# Patient Record
Sex: Female | Born: 1960 | Race: Asian | Hispanic: No | Marital: Single | Smoking: Never smoker
Health system: Southern US, Community
[De-identification: ages and names within clinical notes are randomized; demographics above are authoritative.]

## PROBLEM LIST (undated history)

## (undated) DIAGNOSIS — N809 Endometriosis, unspecified: Secondary | ICD-10-CM

## (undated) DIAGNOSIS — E785 Hyperlipidemia, unspecified: Secondary | ICD-10-CM

## (undated) DIAGNOSIS — I10 Essential (primary) hypertension: Secondary | ICD-10-CM

## (undated) HISTORY — DX: Endometriosis, unspecified: N80.9

## (undated) HISTORY — DX: Hyperlipidemia, unspecified: E78.5

---

## 2000-04-24 ENCOUNTER — Other Ambulatory Visit: Admission: RE | Admit: 2000-04-24 | Discharge: 2000-04-24 | Payer: Self-pay | Admitting: Obstetrics and Gynecology

## 2000-10-14 ENCOUNTER — Encounter: Admission: RE | Admit: 2000-10-14 | Discharge: 2000-10-14 | Payer: Self-pay | Admitting: Hematology and Oncology

## 2001-01-20 ENCOUNTER — Encounter: Admission: RE | Admit: 2001-01-20 | Discharge: 2001-01-20 | Payer: Self-pay

## 2001-05-05 ENCOUNTER — Other Ambulatory Visit: Admission: RE | Admit: 2001-05-05 | Discharge: 2001-05-05 | Payer: Self-pay | Admitting: Obstetrics and Gynecology

## 2001-07-27 ENCOUNTER — Encounter: Admission: RE | Admit: 2001-07-27 | Discharge: 2001-07-27 | Payer: Self-pay | Admitting: Internal Medicine

## 2002-05-07 ENCOUNTER — Other Ambulatory Visit: Admission: RE | Admit: 2002-05-07 | Discharge: 2002-05-07 | Payer: Self-pay | Admitting: Obstetrics and Gynecology

## 2003-05-17 ENCOUNTER — Ambulatory Visit (HOSPITAL_COMMUNITY): Admission: RE | Admit: 2003-05-17 | Discharge: 2003-05-17 | Payer: Self-pay | Admitting: Obstetrics and Gynecology

## 2003-05-17 ENCOUNTER — Encounter: Payer: Self-pay | Admitting: Obstetrics and Gynecology

## 2003-05-31 ENCOUNTER — Other Ambulatory Visit: Admission: RE | Admit: 2003-05-31 | Discharge: 2003-05-31 | Payer: Self-pay | Admitting: Obstetrics and Gynecology

## 2004-05-17 ENCOUNTER — Ambulatory Visit (HOSPITAL_COMMUNITY): Admission: RE | Admit: 2004-05-17 | Discharge: 2004-05-17 | Payer: Self-pay | Admitting: Obstetrics and Gynecology

## 2004-06-05 ENCOUNTER — Other Ambulatory Visit: Admission: RE | Admit: 2004-06-05 | Discharge: 2004-06-05 | Payer: Self-pay | Admitting: Obstetrics and Gynecology

## 2005-05-20 ENCOUNTER — Ambulatory Visit (HOSPITAL_COMMUNITY): Admission: RE | Admit: 2005-05-20 | Discharge: 2005-05-20 | Payer: Self-pay | Admitting: Obstetrics and Gynecology

## 2005-08-21 ENCOUNTER — Other Ambulatory Visit: Admission: RE | Admit: 2005-08-21 | Discharge: 2005-08-21 | Payer: Self-pay | Admitting: Obstetrics and Gynecology

## 2005-12-04 ENCOUNTER — Encounter: Payer: Self-pay | Admitting: Family Medicine

## 2006-05-29 ENCOUNTER — Ambulatory Visit (HOSPITAL_COMMUNITY): Admission: RE | Admit: 2006-05-29 | Discharge: 2006-05-29 | Payer: Self-pay | Admitting: Obstetrics and Gynecology

## 2006-09-10 ENCOUNTER — Ambulatory Visit: Payer: Self-pay | Admitting: Internal Medicine

## 2006-09-16 ENCOUNTER — Ambulatory Visit: Payer: Self-pay | Admitting: Internal Medicine

## 2006-09-25 ENCOUNTER — Other Ambulatory Visit: Admission: RE | Admit: 2006-09-25 | Discharge: 2006-09-25 | Payer: Self-pay | Admitting: Obstetrics & Gynecology

## 2006-12-05 ENCOUNTER — Ambulatory Visit: Payer: Self-pay | Admitting: Internal Medicine

## 2007-07-13 ENCOUNTER — Ambulatory Visit (HOSPITAL_COMMUNITY): Admission: RE | Admit: 2007-07-13 | Discharge: 2007-07-13 | Payer: Self-pay | Admitting: Obstetrics and Gynecology

## 2007-08-26 ENCOUNTER — Ambulatory Visit: Payer: Self-pay | Admitting: Family Medicine

## 2007-08-26 DIAGNOSIS — E785 Hyperlipidemia, unspecified: Secondary | ICD-10-CM | POA: Insufficient documentation

## 2007-08-28 ENCOUNTER — Encounter (INDEPENDENT_AMBULATORY_CARE_PROVIDER_SITE_OTHER): Payer: Self-pay | Admitting: *Deleted

## 2007-08-28 LAB — CONVERTED CEMR LAB
AST: 18 units/L (ref 0–37)
Albumin: 3.9 g/dL (ref 3.5–5.2)
Alkaline Phosphatase: 46 units/L (ref 39–117)
BUN: 10 mg/dL (ref 6–23)
Chloride: 109 meq/L (ref 96–112)
Cholesterol: 226 mg/dL (ref 0–200)
Creatinine, Ser: 0.7 mg/dL (ref 0.4–1.2)
Sodium: 143 meq/L (ref 135–145)
Total Bilirubin: 0.7 mg/dL (ref 0.3–1.2)
VLDL: 20 mg/dL (ref 0–40)

## 2007-09-01 ENCOUNTER — Encounter: Payer: Self-pay | Admitting: Family Medicine

## 2007-10-15 ENCOUNTER — Other Ambulatory Visit: Admission: RE | Admit: 2007-10-15 | Discharge: 2007-10-15 | Payer: Self-pay | Admitting: Obstetrics and Gynecology

## 2008-06-01 ENCOUNTER — Ambulatory Visit: Payer: Self-pay | Admitting: Family Medicine

## 2008-06-01 DIAGNOSIS — K120 Recurrent oral aphthae: Secondary | ICD-10-CM

## 2008-08-03 ENCOUNTER — Ambulatory Visit (HOSPITAL_COMMUNITY): Admission: RE | Admit: 2008-08-03 | Discharge: 2008-08-03 | Payer: Self-pay | Admitting: Obstetrics and Gynecology

## 2008-11-17 ENCOUNTER — Other Ambulatory Visit: Admission: RE | Admit: 2008-11-17 | Discharge: 2008-11-17 | Payer: Self-pay | Admitting: Obstetrics and Gynecology

## 2008-12-01 ENCOUNTER — Ambulatory Visit: Payer: Self-pay | Admitting: Family Medicine

## 2008-12-01 DIAGNOSIS — T1490XA Injury, unspecified, initial encounter: Secondary | ICD-10-CM | POA: Insufficient documentation

## 2008-12-05 ENCOUNTER — Other Ambulatory Visit: Admission: RE | Admit: 2008-12-05 | Discharge: 2008-12-05 | Payer: Self-pay | Admitting: Obstetrics and Gynecology

## 2009-05-10 ENCOUNTER — Ambulatory Visit: Payer: Self-pay | Admitting: Family Medicine

## 2009-05-10 DIAGNOSIS — R142 Eructation: Secondary | ICD-10-CM

## 2009-05-10 DIAGNOSIS — R143 Flatulence: Secondary | ICD-10-CM

## 2009-05-10 DIAGNOSIS — R141 Gas pain: Secondary | ICD-10-CM

## 2009-05-10 DIAGNOSIS — H6980 Other specified disorders of Eustachian tube, unspecified ear: Secondary | ICD-10-CM | POA: Insufficient documentation

## 2009-05-10 DIAGNOSIS — L989 Disorder of the skin and subcutaneous tissue, unspecified: Secondary | ICD-10-CM | POA: Insufficient documentation

## 2009-05-17 ENCOUNTER — Telehealth: Payer: Self-pay | Admitting: Family Medicine

## 2009-08-23 ENCOUNTER — Ambulatory Visit (HOSPITAL_COMMUNITY): Admission: RE | Admit: 2009-08-23 | Discharge: 2009-08-23 | Payer: Self-pay | Admitting: Obstetrics and Gynecology

## 2009-08-25 ENCOUNTER — Ambulatory Visit: Payer: Self-pay | Admitting: Family Medicine

## 2009-08-25 DIAGNOSIS — K219 Gastro-esophageal reflux disease without esophagitis: Secondary | ICD-10-CM

## 2010-10-04 ENCOUNTER — Ambulatory Visit (HOSPITAL_COMMUNITY): Admission: RE | Admit: 2010-10-04 | Discharge: 2010-10-04 | Payer: Self-pay | Admitting: Obstetrics and Gynecology

## 2011-02-28 LAB — HM PAP SMEAR

## 2011-04-19 NOTE — Assessment & Plan Note (Signed)
[facility]                           GASTROENTEROLOGY OFFICE NOTE   NAME:SHIRAISHICyrena, [name]                      MRN:          [id number]  DATE:09/10/2006                            DOB:          10-29-1961    [name] is a very nice 51 year old Asian woman, who we saw 4 years ago  for evaluation of _______location of abdominal pain and change in bowel  habits.  Her colonoscopy at that time showed internal hemorrhoids, but  otherwise normal exam.  She comes today with complaints of extreme bloating  and gas, which is passed as flatus but also belches.  She has some lactose  intolerance.  This has been going on for about a year.  She initially made  an appointment a year ago, but could not keep it.  Her eating habits are  regular.  She drinks usually coffee for breakfast, has [place] meal for  lunch, and some fish with vegetables for supper.  Her weight has been stable  at 120 pounds.  She denies any symptoms of gastroesophageal reflux.  There  is no family history of gallbladder disease.  She denies any excessive  stress.   FAMILY HISTORY:  Positive for diabetes in her father.  No family history of  colon cancer.   SOCIAL HISTORY:  She is single, lives with her boyfriend, works as a  Production designer, theatre/television/film.  She does not smoke, does not drink alcohol.   REVIEW OF SYSTEMS:  Positive for GI symptoms.   PHYSICAL EXAMINATION:  Blood pressure 118/80, pulse 68, weight 121 pounds.  She was mildly anxious, alert, oriented, healthy-appearing.  SKIN:  Warm and dry.  LUNGS:  Clear to auscultation.  Cor with normal S1, normal S2.  ABDOMEN:  Soft, somewhat tympanitic in the upper abdomen, with somewhat  hyperactive bowel sounds.  There was minimal discomfort in the left lower  quadrant, normal exam of the upper abdomen with liver edge at costal margin,  no scars.  RECTAL:  Exam normal rectal tone, stool was soft, Hemoccult-negative.   IMPRESSION:  A 50 year old  Asian woman with dyspepsia, bloating, flatulence,  suggestive for functional gastrointestinal problems.  We need to rule out  biliary dysfunction, celiac sprue, bacterial overgrowth, possible  constipation.   PLAN:  1. Check tissue transglutaminase and CBC today.  2. Start Pepcid 40 mg p.o. daily.  3. For bacterial overgrowth Flagyl 250 mg p.o. t.i.d. for one week.  4. Because of constipation and reported from the patient that colonoscopy      prep helped her last time, we will give her MiraLax prep to use it one      time, then continue 17 grams of MiraLax twice a week.  5. Return to the office in 8 weeks to follow up.  I would consider using      antispasmodics in the future.       Hedwig Morton. Juanda Chance, MD      DMB/MedQ  DD:  09/10/2006  DT:  09/12/2006  Job #:  [id number]   cc:   Clayborn Bigness, MD

## 2011-04-19 NOTE — Assessment & Plan Note (Signed)
[facility]                         GASTROENTEROLOGY OFFICE NOTE   NAME:SHIRAISHIKendel, Pesnell                      MRN:          [id number]  DATE:12/05/2006                            DOB:          07/19/1961    Ms. Austin is a 50 year old Asian female, whom we have been following  for irritable bowel syndrome.  Her main symptom is gas.  She feels she  has inappropriate flatulence.  The problem is that she does not know  when she passes gas and she is surprised when people turn around and  look at her.  She is afraid to leave her house, except for going to  work.  She has tried to eliminate certain foods.  She has taken the  usual over-the-counter medications.  There is no abdominal pain.  We  have been treating her constipation with MiraLax 17 g two to three times  a week with good results.  There has been no constipation, but the gas  problem continues.  She had a colonoscopy in July 2003, which showed  internal hemorrhoids, but otherwise normal exam.   Her medications include calcium supplements, estrogen, doxycycline,  Pepcid 40 mg a day and MiraLax 17 g daily.   PHYSICAL EXAMINATION:  Blood pressure 120/70, pulse 60 and weight 126  pounds and stable.  She was alert and oriented and in no distress.  Lungs were clear to auscultation.  Cor with normal S1, normal S2.  Abdomen was soft and relaxed.  Normoactive bowel sounds.  No abnormal  rashes, no distention, no tenderness.  Rectal exam showed normal rectal  tone.  There was no incompetency.  Squeeze was normal.  Stool was soft,  Hemoccult negative.   The patient is a 50 year old Asian female with irritable bowel  syndrome/flatulence.  I am not sure that she actually has flatulence or  if it is just an anxiety reaction on her part.  This apparently occurs  at work, as well as at home.  She has no other symptoms, such as  abdominal pain or change in the bowel habits, to indicate spastic colon.  We  have tried her on Flagyl to stabilize her bacterial flora with no  noticeable results.   PLAN:  1. A trial of Align one p.o. daily, samples given for two weeks.  2. Robinul Forte 2 mg q.a.m. as an antispasmodic.  3. Begin Elavil 25 mg q.h.s. to reduce  the GI sensation and      hypersensitivity.  4. I would like to see her in six to eight weeks again and discuss the      problem again.     Hedwig Morton. Juanda Chance, MD  Electronically Signed    DMB/MedQ  DD: 12/05/2006  DT: 12/05/2006  Job #: [id number]   cc:   Ross Marcus, Dr.

## 2011-08-29 ENCOUNTER — Encounter: Payer: Self-pay | Admitting: Family Medicine

## 2011-08-29 ENCOUNTER — Telehealth: Payer: Self-pay | Admitting: Family Medicine

## 2011-08-29 NOTE — Telephone Encounter (Signed)
Pt would like to discuss liquid coming out of back of both ears.  Not sure why piercing is infected, has not worn other's earrings, and has had ear's pierced for more than 6 months.  She has used alcohol and neosporin with no relief.  Please call back at [phone].  Thanks

## 2011-08-29 NOTE — Telephone Encounter (Signed)
Advised patient appt needed and appt made for tomorrow

## 2011-08-30 ENCOUNTER — Encounter: Payer: Self-pay | Admitting: Family Medicine

## 2011-08-30 ENCOUNTER — Ambulatory Visit (INDEPENDENT_AMBULATORY_CARE_PROVIDER_SITE_OTHER): Payer: BC Managed Care – PPO | Admitting: Family Medicine

## 2011-08-30 DIAGNOSIS — R21 Rash and other nonspecific skin eruption: Secondary | ICD-10-CM

## 2011-08-30 DIAGNOSIS — L0390 Cellulitis, unspecified: Secondary | ICD-10-CM

## 2011-08-30 DIAGNOSIS — M79671 Pain in right foot: Secondary | ICD-10-CM

## 2011-08-30 DIAGNOSIS — M79609 Pain in unspecified limb: Secondary | ICD-10-CM

## 2011-08-30 MED ORDER — CEPHALEXIN 500 MG PO CAPS: 500.0000 mg | ORAL_CAPSULE | Freq: Three times a day (TID) | ORAL | Status: AC

## 2011-08-30 MED ORDER — TRIAMCINOLONE ACETONIDE 0.5 % EX CREA: TOPICAL_CREAM | Freq: Two times a day (BID) | CUTANEOUS | Status: DC

## 2011-08-30 NOTE — Assessment & Plan Note (Signed)
Given location will cover for cellulitis with keflex for 7 days. Given chronicity of issue.. More likely discharge from inflammation... Possible eczema... Start to[pical steroid cream BID. Follow up if not improving.

## 2011-08-30 NOTE — Patient Instructions (Addendum)
Start antibiotic to cover for skin infection. Also start applying topical steroid behind ears B  Twice daily for 2 weeks. Review info packet on foot.  Most likely metatarsalgia given location of pain. . Wear comfortable shoes. Put metatarsal pad in shoes as directed. If not improving in next 2 weeks make appt to see Dr. Patsy [facility]ager [facility].

## 2011-08-30 NOTE — Assessment & Plan Note (Signed)
Most likely metatarsalgia given location of pain. Info given on issue. Wear comfortable shoes. Put metatarsal pad in shoes as directed. If not improving in next 2 weeks make appt to see Dr. Patsy Lager [facility].

## 2011-08-30 NOTE — Progress Notes (Signed)
  Subjective:    Patient ID: [name], female    DOB: 1961-11-03, 50 y.o.   MRN: [id number]  HPI 50 year old female presents with drainage and redness at old ear piercing site. Drainage is clear, yellowish. Ongoing for several months. No pain. No fever. Has noted small red itchy rash on scalp 2 days ago.   Has noted tenderness in last week in ball of right shoes. No pain with pressure but feels like "glass ball" in ball of foot centrally. No redness or swelling in foot. Wears 1-2 inch heels , usually flats. Walks 3 miles a day.   Review of Systems  Constitutional: Negative for fever and fatigue.  HENT: Negative for ear pain, congestion, rhinorrhea and tinnitus.   Eyes: Negative for pain.  Respiratory: Negative for chest tightness and shortness of breath.   Cardiovascular: Negative for chest pain, palpitations and leg swelling.  Gastrointestinal: Negative for abdominal pain.  Genitourinary: Negative for dysuria.       Objective:   Physical Exam  Constitutional: Vital signs are normal. She appears well-developed and well-nourished. She is cooperative.  Non-toxic appearance. She does not appear ill. No distress.  HENT:  Head: Normocephalic.  Right Ear: Hearing, tympanic membrane and ear canal normal. Tympanic membrane is not erythematous, not retracted and not bulging.  Left Ear: Hearing, tympanic membrane and ear canal normal. Tympanic membrane is not erythematous, not retracted and not bulging.  Nose: Mucosal edema and rhinorrhea present. Right sinus exhibits no maxillary sinus tenderness and no frontal sinus tenderness. Left sinus exhibits no maxillary sinus tenderness and no frontal sinus tenderness.  Mouth/Throat: Uvula is midline, oropharynx is clear and moist and mucous membranes are normal.       See skin for ext ear exam  Eyes: Conjunctivae, EOM and lids are normal. Pupils are equal, round, and reactive to light. No foreign bodies found.  Neck: Trachea normal and  normal range of motion. Neck supple. Carotid bruit is not present. No mass and no thyromegaly present.  Cardiovascular: Normal rate, regular rhythm, S1 normal, S2 normal, normal heart sounds, intact distal pulses and normal pulses.  Exam reveals no gallop and no friction rub.   No murmur heard. Pulmonary/Chest: Effort normal and breath sounds normal. Not tachypneic. No respiratory distress. She has no decreased breath sounds. She has no wheezes. She has no rhonchi. She has no rales.  Genitourinary: Vagina normal and uterus normal.  Musculoskeletal:       Right foot: She exhibits normal range of motion, no tenderness, no bony tenderness, no swelling, no crepitus and no deformity.       Left foot: Normal.       Pain acroos central metatarsals.  No foot pronation, flattened horizontal arch.  Neurological: She is alert.  Skin: Skin is warm, dry and intact. Rash noted.       Red dry skin behind right ear in crease, behind left ear erythema and clear discharge.. Some associated with peircing, but mainly in crease. No pain  Small irritated hair follicle on scalp far from ear.  Psychiatric: Her speech is normal and behavior is normal. Judgment normal. Her mood appears not anxious. Cognition and memory are normal. She does not exhibit a depressed mood.          Assessment & Plan:

## 2011-10-17 ENCOUNTER — Other Ambulatory Visit: Payer: Self-pay | Admitting: Obstetrics and Gynecology

## 2011-10-17 DIAGNOSIS — Z1231 Encounter for screening mammogram for malignant neoplasm of breast: Secondary | ICD-10-CM

## 2011-11-11 ENCOUNTER — Encounter (HOSPITAL_COMMUNITY): Payer: Self-pay | Admitting: Pharmacist

## 2011-11-13 ENCOUNTER — Encounter (HOSPITAL_COMMUNITY): Payer: Self-pay

## 2011-11-13 ENCOUNTER — Encounter (HOSPITAL_COMMUNITY)
Admission: RE | Admit: 2011-11-13 | Discharge: 2011-11-13 | Disposition: A | Discharge status: Home / Self Care | Payer: BC Managed Care – PPO | Source: Ambulatory Visit | Attending: Obstetrics and Gynecology | Admitting: Obstetrics and Gynecology

## 2011-11-13 ENCOUNTER — Other Ambulatory Visit: Payer: Self-pay

## 2011-11-13 LAB — SURGICAL PCR SCREEN
MRSA, PCR: NEGATIVE
Staphylococcus aureus: NEGATIVE

## 2011-11-13 LAB — CBC
Hemoglobin: 13.7 g/dL (ref 12.0–15.0)
MCH: 30.4 pg (ref 26.0–34.0)
MCV: 90.7 fL (ref 78.0–100.0)
RBC: 4.51 MIL/uL (ref 3.87–5.11)

## 2011-11-13 NOTE — Patient Instructions (Addendum)
20 [name]  11/13/2011   Your procedure is scheduled on:  11/19/11  Enter through the Main Entrance of [facility] at 6 AM.  Pick up the phone at the desk and dial [phone].   Call this number if you have problems the morning of surgery: [phone]   Remember:   Do not eat food:After Midnight.  Do not drink clear liquids: After Midnight.  Take these medicines the morning of surgery with A SIP OF WATER: NA   Do not wear jewelry, make-up or nail polish.  Do not wear lotions, powders, or perfumes. You may wear deodorant.  Do not shave 48 hours prior to surgery.  Do not bring valuables to the hospital.  Contacts, dentures or bridgework may not be worn into surgery.  Leave suitcase in the car. After surgery it may be brought to your room.  For patients admitted to the hospital, checkout time is 11:00 AM the day of discharge.   Patients discharged the day of surgery will not be allowed to drive home.  Name and phone number of your driver: Mohammed Kindle  [phone]  Special Instructions: CHG Shower Shower 2 days before surgery and 1 day before surgery with Hibiclens.   Please read over the following fact sheets that you were given: MRSA Information

## 2011-11-18 ENCOUNTER — Ambulatory Visit (HOSPITAL_COMMUNITY)
Admission: RE | Admit: 2011-11-18 | Discharge: 2011-11-18 | Disposition: A | Discharge status: Home / Self Care | Payer: BC Managed Care – PPO | Source: Ambulatory Visit | Attending: Obstetrics and Gynecology | Admitting: Obstetrics and Gynecology

## 2011-11-18 DIAGNOSIS — Z1231 Encounter for screening mammogram for malignant neoplasm of breast: Secondary | ICD-10-CM | POA: Insufficient documentation

## 2011-11-18 NOTE — H&P (Signed)
[name] is an 50 y.o. female.   Chief Complaint:abnormal uterine bleeding  HPI: Single chinese female,G 0 ,  menopausal, on HRT, with recurrent DUB.  PUS showed her uterus is 10 x 6 x 6 cm with two 4 cm fibroids, and a 4 cm clear L adnexal cyst.  Pt wants definitive therapy, with R - TLH/BSO.  Past Medical History  Diagnosis Date  . Hyperlipidemia   . No pertinent past medical history     Past Surgical History  Procedure Date  . No past surgeries     Family History  Problem Relation Age of Onset  . Stroke Mother   . Diabetes Father   . Kidney disease Father   . Hypertension Father   . Hyperlipidemia Father   . Coronary artery disease Father   . Hyperlipidemia Brother   . Hyperlipidemia Brother    Social History:  reports that she has never smoked. She does not have any smokeless tobacco history on file. She reports that she does not drink alcohol or use illicit drugs.She works as an Print production planner.  She is single, but has a significant other for 25 years together.  Allergies: No Known Allergies  No current facility-administered medications on file as of .   Medications Prior to Admission  Medication Sig Dispense Refill  . acetaminophen (TYLENOL) 325 MG tablet Take 650 mg by mouth daily as needed. For pain        . progesterone (PROMETRIUM) 200 MG capsule Take 1 capsule by mouth continuous. Only 12 days of the month      . VIVELLE-DOT 0.1 MG/24HR Changes patch on Sunday and Thursday        No results found for this or any previous visit (from the past 48 hour(s)). No results found.  ROS N/c  There were no vitals taken for this visit. Physical Exam HEENT wnl.  Lungs clear.  RRR. Abd soft, NT, nl BS,  No masses.  Extrem nl.  Pelvic:  EG nl. Vag/cx nl.  BM - uterus 10 cm, firm, decreased mobility.  Adnexa nl.  Assessment/Plan Fibroid uterus.  Plan R - TLH/BSO.  ROMINE,CYNTHIA P 11/18/2011, 4:04 PM

## 2011-11-19 ENCOUNTER — Ambulatory Visit (HOSPITAL_COMMUNITY)
Admission: RE | Admit: 2011-11-19 | Discharge: 2011-11-19 | Disposition: A | Discharge status: Home / Self Care | Payer: BC Managed Care – PPO | Source: Ambulatory Visit | Attending: Obstetrics and Gynecology | Admitting: Obstetrics and Gynecology

## 2011-11-19 ENCOUNTER — Encounter (HOSPITAL_COMMUNITY): Payer: Self-pay | Admitting: *Deleted

## 2011-11-19 ENCOUNTER — Ambulatory Visit (HOSPITAL_COMMUNITY): Payer: BC Managed Care – PPO | Admitting: Anesthesiology

## 2011-11-19 ENCOUNTER — Encounter (HOSPITAL_COMMUNITY)
Admission: RE | Disposition: A | Discharge status: Home / Self Care | Payer: Self-pay | Source: Ambulatory Visit | Attending: Obstetrics and Gynecology

## 2011-11-19 ENCOUNTER — Encounter (HOSPITAL_COMMUNITY): Payer: Self-pay | Admitting: Anesthesiology

## 2011-11-19 ENCOUNTER — Other Ambulatory Visit: Payer: Self-pay | Admitting: Obstetrics and Gynecology

## 2011-11-19 DIAGNOSIS — D279 Benign neoplasm of unspecified ovary: Secondary | ICD-10-CM | POA: Insufficient documentation

## 2011-11-19 DIAGNOSIS — N950 Postmenopausal bleeding: Secondary | ICD-10-CM | POA: Insufficient documentation

## 2011-11-19 DIAGNOSIS — N8000 Endometriosis of the uterus, unspecified: Secondary | ICD-10-CM | POA: Insufficient documentation

## 2011-11-19 DIAGNOSIS — N838 Other noninflammatory disorders of ovary, fallopian tube and broad ligament: Secondary | ICD-10-CM | POA: Insufficient documentation

## 2011-11-19 DIAGNOSIS — Z01818 Encounter for other preprocedural examination: Secondary | ICD-10-CM | POA: Insufficient documentation

## 2011-11-19 DIAGNOSIS — Z01812 Encounter for preprocedural laboratory examination: Secondary | ICD-10-CM | POA: Insufficient documentation

## 2011-11-19 HISTORY — DX: Essential (primary) hypertension: I10

## 2011-11-19 HISTORY — PX: ROBOTIC ASSISTED LAP VAGINAL HYSTERECTOMY: SHX2362

## 2011-11-19 HISTORY — PX: CYSTOSCOPY: SHX5120

## 2011-11-19 LAB — BASIC METABOLIC PANEL
Calcium: 9.2 mg/dL (ref 8.4–10.5)
GFR calc non Af Amer: >90 mL/min (ref >90)
Glucose, Bld: 107 mg/dL — ABNORMAL HIGH (ref 70–99)
Sodium: 137 mEq/L (ref 135–145)

## 2011-11-19 SURGERY — ROBOTIC ASSISTED TOTAL HYSTERECTOMY
Anesthesia: General | Site: Uterus | Wound class: Clean Contaminated

## 2011-11-19 MED ORDER — MIDAZOLAM HCL 5 MG/5ML IJ SOLN
INTRAMUSCULAR | Status: DC | PRN
  Administered 2011-11-19 (×2): 1 mg via INTRAVENOUS

## 2011-11-19 MED ORDER — HYDROMORPHONE HCL PF 1 MG/ML IJ SOLN: 0.2500 mg | INTRAMUSCULAR | Status: DC | PRN

## 2011-11-19 MED ORDER — MEPERIDINE HCL 25 MG/ML IJ SOLN: 6.2500 mg | INTRAMUSCULAR | Status: DC | PRN

## 2011-11-19 MED ORDER — MIDAZOLAM HCL 2 MG/2ML IJ SOLN
INTRAMUSCULAR | Status: AC
  Filled 2011-11-19: qty 2

## 2011-11-19 MED ORDER — INDIGOTINDISULFONATE SODIUM 8 MG/ML IJ SOLN
INTRAMUSCULAR | Status: DC | PRN
  Administered 2011-11-19: 40 mg via INTRAVENOUS

## 2011-11-19 MED ORDER — LACTATED RINGERS IV SOLN
INTRAVENOUS | Status: DC
  Administered 2011-11-19 (×3): via INTRAVENOUS

## 2011-11-19 MED ORDER — ROPIVACAINE HCL 5 MG/ML IJ SOLN
INTRAMUSCULAR | Status: DC | PRN
  Administered 2011-11-19: 60 mL

## 2011-11-19 MED ORDER — LACTATED RINGERS IR SOLN
Status: DC | PRN
  Administered 2011-11-19: 3000 mL

## 2011-11-19 MED ORDER — PROMETHAZINE HCL 25 MG/ML IJ SOLN: 12.5000 mg | INTRAMUSCULAR | Status: DC | PRN

## 2011-11-19 MED ORDER — FENTANYL CITRATE 0.05 MG/ML IJ SOLN
INTRAMUSCULAR | Status: AC
Start: 2011-11-19 — End: 2011-11-19
  Filled 2011-11-19: qty 5

## 2011-11-19 MED ORDER — PROPOFOL 10 MG/ML IV EMUL
INTRAVENOUS | Status: AC
  Filled 2011-11-19: qty 20

## 2011-11-19 MED ORDER — OXYCODONE-ACETAMINOPHEN 5-325 MG PO TABS: 1.0000 | ORAL_TABLET | ORAL | Status: DC | PRN

## 2011-11-19 MED ORDER — KETOROLAC TROMETHAMINE 30 MG/ML IJ SOLN: 15.0000 mg | Freq: Once | INTRAMUSCULAR | Status: DC | PRN

## 2011-11-19 MED ORDER — FENTANYL CITRATE 0.05 MG/ML IJ SOLN
INTRAMUSCULAR | Status: DC | PRN
  Administered 2011-11-19 (×5): 50 ug via INTRAVENOUS

## 2011-11-19 MED ORDER — LIDOCAINE HCL (CARDIAC) 20 MG/ML IV SOLN
INTRAVENOUS | Status: AC
  Filled 2011-11-19: qty 5

## 2011-11-19 MED ORDER — NEOSTIGMINE METHYLSULFATE 1 MG/ML IJ SOLN
INTRAMUSCULAR | Status: DC | PRN
  Administered 2011-11-19: 4 mg via INTRAVENOUS

## 2011-11-19 MED ORDER — GLYCOPYRROLATE 0.2 MG/ML IJ SOLN
INTRAMUSCULAR | Status: DC | PRN
  Administered 2011-11-19: .8 mg via INTRAVENOUS

## 2011-11-19 MED ORDER — DEXAMETHASONE SODIUM PHOSPHATE 4 MG/ML IJ SOLN
INTRAMUSCULAR | Status: DC | PRN
  Administered 2011-11-19: 10 mg via INTRAVENOUS

## 2011-11-19 MED ORDER — ROCURONIUM BROMIDE 50 MG/5ML IV SOLN
INTRAVENOUS | Status: AC
  Filled 2011-11-19: qty 2

## 2011-11-19 MED ORDER — ONDANSETRON HCL 4 MG/2ML IJ SOLN
INTRAMUSCULAR | Status: DC | PRN
  Administered 2011-11-19: 4 mg via INTRAVENOUS

## 2011-11-19 MED ORDER — ROCURONIUM BROMIDE 100 MG/10ML IV SOLN
INTRAVENOUS | Status: DC | PRN
  Administered 2011-11-19 (×3): 10 mg via INTRAVENOUS
  Administered 2011-11-19: 50 mg via INTRAVENOUS

## 2011-11-19 MED ORDER — LIDOCAINE HCL (CARDIAC) 20 MG/ML IV SOLN
INTRAVENOUS | Status: DC | PRN
  Administered 2011-11-19: 40 mg via INTRAVENOUS

## 2011-11-19 MED ORDER — DEXTROSE 5 % IV SOLN
1.0000 g | Freq: Once | INTRAVENOUS | Status: AC
  Administered 2011-11-19: 1 g via INTRAVENOUS
  Filled 2011-11-19: qty 1

## 2011-11-19 MED ORDER — ONDANSETRON HCL 4 MG/2ML IJ SOLN
INTRAMUSCULAR | Status: AC
  Filled 2011-11-19: qty 2

## 2011-11-19 MED ORDER — DEXAMETHASONE SODIUM PHOSPHATE 10 MG/ML IJ SOLN
INTRAMUSCULAR | Status: AC
  Filled 2011-11-19: qty 1

## 2011-11-19 MED ORDER — SODIUM CHLORIDE 0.9 % IJ SOLN
INTRAMUSCULAR | Status: DC | PRN
  Administered 2011-11-19: 10 mL

## 2011-11-19 MED ORDER — STERILE WATER FOR IRRIGATION IR SOLN
Status: DC | PRN
  Administered 2011-11-19: 1000 mL via INTRAVESICAL

## 2011-11-19 MED ORDER — PROMETHAZINE HCL 25 MG/ML IJ SOLN: 6.2500 mg | INTRAMUSCULAR | Status: DC | PRN

## 2011-11-19 MED ORDER — GLYCOPYRROLATE 0.2 MG/ML IJ SOLN
INTRAMUSCULAR | Status: AC
  Filled 2011-11-19: qty 1

## 2011-11-19 MED ORDER — PROPOFOL 10 MG/ML IV EMUL
INTRAVENOUS | Status: DC | PRN
  Administered 2011-11-19: 150 mg via INTRAVENOUS

## 2011-11-19 MED ORDER — NEOSTIGMINE METHYLSULFATE 1 MG/ML IJ SOLN
INTRAMUSCULAR | Status: AC
  Filled 2011-11-19: qty 10

## 2011-11-19 SURGICAL SUPPLY — 49 items
BAG URINE DRAINAGE (UROLOGICAL SUPPLIES) ×3 IMPLANT
BARRIER ADHS 3X4 INTERCEED (GAUZE/BANDAGES/DRESSINGS) ×3 IMPLANT
BENZOIN TINCTURE PRP APPL 2/3 (GAUZE/BANDAGES/DRESSINGS) ×3 IMPLANT
CABLE HIGH FREQUENCY MONO STRZ (ELECTRODE) ×3 IMPLANT
CATH FOLEY 3WAY  5CC 16FR (CATHETERS) ×1
CATH FOLEY 3WAY 5CC 16FR (CATHETERS) ×2 IMPLANT
CONT PATH 16OZ SNAP LID 3702 (MISCELLANEOUS) ×3 IMPLANT
COVER MAYO STAND STRL (DRAPES) ×3 IMPLANT
COVER TABLE BACK 60X90 (DRAPES) ×6 IMPLANT
COVER TIP SHEARS 8 DVNC (MISCELLANEOUS) ×2 IMPLANT
COVER TIP SHEARS 8MM DA VINCI (MISCELLANEOUS) ×1
DECANTER SPIKE VIAL GLASS SM (MISCELLANEOUS) ×6 IMPLANT
DRAPE HUG U DISPOSABLE (DRAPE) ×3 IMPLANT
DRAPE LG THREE QUARTER DISP (DRAPES) ×6 IMPLANT
DRAPE WARM FLUID 44X44 (DRAPE) ×3 IMPLANT
ELECT REM PT RETURN 9FT ADLT (ELECTROSURGICAL) ×3
ELECTRODE REM PT RTRN 9FT ADLT (ELECTROSURGICAL) ×2 IMPLANT
EVACUATOR SMOKE 8.L (FILTER) ×3 IMPLANT
GAUZE VASELINE 3X9 (GAUZE/BANDAGES/DRESSINGS) IMPLANT
GLOVE BIOGEL PI IND STRL 7.0 (GLOVE) ×12 IMPLANT
GLOVE BIOGEL PI INDICATOR 7.0 (GLOVE) ×6
GLOVE ECLIPSE 6.5 STRL STRAW (GLOVE) ×15 IMPLANT
GOWN STRL REIN XL XLG (GOWN DISPOSABLE) ×21 IMPLANT
KIT ACCESSORY DA VINCI DISP (KITS) ×1
KIT ACCESSORY DVNC DISP (KITS) ×2 IMPLANT
NEEDLE INSUFFLATION 14GA 120MM (NEEDLE) ×3 IMPLANT
NS IRRIG 1000ML POUR BTL (IV SOLUTION) ×9 IMPLANT
OCCLUDER COLPOPNEUMO (BALLOONS) ×3 IMPLANT
PACK LAVH (CUSTOM PROCEDURE TRAY) ×3 IMPLANT
PAD PREP 24X48 CUFFED NSTRL (MISCELLANEOUS) ×6 IMPLANT
PLUG CATH AND CAP STER (CATHETERS) ×3 IMPLANT
SET CYSTO W/LG BORE CLAMP LF (SET/KITS/TRAYS/PACK) ×3 IMPLANT
SET IRRIG TUBING LAPAROSCOPIC (IRRIGATION / IRRIGATOR) ×3 IMPLANT
SOLUTION ELECTROLUBE (MISCELLANEOUS) ×3 IMPLANT
STRIP CLOSURE SKIN 1/4X4 (GAUZE/BANDAGES/DRESSINGS) ×3 IMPLANT
SUT VIC AB 0 CT1 27 (SUTURE) ×1
SUT VIC AB 0 CT1 27XBRD ANBCTR (SUTURE) ×2 IMPLANT
SUT VIC AB 0 CT1 27XBRD ANTBC (SUTURE) IMPLANT
SUT VICRYL 0 UR6 27IN ABS (SUTURE) ×3 IMPLANT
SUT VICRYL RAPIDE 4/0 PS 2 (SUTURE) ×6 IMPLANT
SYR 30ML LL (SYRINGE) ×3 IMPLANT
SYR 50ML LL SCALE MARK (SYRINGE) ×3 IMPLANT
TIP UTERINE 6.7X10CM GRN DISP (MISCELLANEOUS) ×3 IMPLANT
TOWEL OR 17X24 6PK STRL BLUE (TOWEL DISPOSABLE) ×9 IMPLANT
TROCAR DISP BLADELESS 8 DVNC (TROCAR) ×2 IMPLANT
TROCAR DISP BLADELESS 8MM (TROCAR) ×1
TROCAR XCEL NON-BLD 5MMX100MML (ENDOMECHANICALS) ×3 IMPLANT
TUBING FILTER THERMOFLATOR (ELECTROSURGICAL) ×3 IMPLANT
WARMER LAPAROSCOPE (MISCELLANEOUS) ×3 IMPLANT

## 2011-11-19 NOTE — Addendum Note (Signed)
Addendum  created 11/19/11 1655 by Nashay Brickley Marie Aiyla Baucom, RN   Modules edited:Notes Section    

## 2011-11-19 NOTE — Anesthesia Preprocedure Evaluation (Signed)
Anesthesia Evaluation  Patient identified by MRN, date of birth, ID band Patient awake    Reviewed: Allergy & Precautions, H&P , NPO status , Patient's Chart, lab work & pertinent test results, reviewed documented beta blocker date and time   Airway Mallampati: I TM Distance: >3 FB Neck ROM: full    Dental No notable dental hx. (+) Teeth Intact   Pulmonary neg pulmonary ROS,    Pulmonary exam normal       Cardiovascular neg cardio ROS     Neuro/Psych Negative Neurological ROS  Negative Psych ROS   GI/Hepatic Neg liver ROS,   Endo/Other  Negative Endocrine ROS  Renal/GU negative Renal ROS  Genitourinary negative   Musculoskeletal negative musculoskeletal ROS (+)   Abdominal Normal abdominal exam  (+)   Peds negative pediatric ROS (+)  Hematology negative hematology ROS (+)   Anesthesia Other Findings   Reproductive/Obstetrics negative OB ROS                           Anesthesia Physical Anesthesia Plan  ASA: I  Anesthesia Plan: General   Post-op Pain Management:    Induction: Intravenous  Airway Management Planned: Oral ETT  Additional Equipment:   Intra-op Plan:   Post-operative Plan: Extubation in OR  Informed Consent: I have reviewed the patients History and Physical, chart, labs and discussed the procedure including the risks, benefits and alternatives for the proposed anesthesia with the patient or authorized representative who has indicated his/her understanding and acceptance.   Dental Advisory Given  Plan Discussed with: CRNA  Anesthesia Plan Comments:         Anesthesia Quick Evaluation

## 2011-11-19 NOTE — Transfer of Care (Signed)
Immediate Anesthesia Transfer of Care Note  Patient: [name]  Procedure(s) Performed:  ROBOTIC ASSISTED TOTAL HYSTERECTOMY - with Left Salpingo-Oophorectomy; CYSTOSCOPY  Patient Location: PACU  Anesthesia Type: General  Level of Consciousness: awake, alert  and oriented  Airway & Oxygen Therapy: Patient Spontanous Breathing and Patient connected to nasal cannula oxygen  Post-op Assessment: Report given to PACU RN and Post -op Vital signs reviewed and stable  Post vital signs: Reviewed and stable  Complications: No apparent anesthesia complications

## 2011-11-19 NOTE — Anesthesia Postprocedure Evaluation (Signed)
  Anesthesia Post-op Note  Patient: [name]  Procedure(s) Performed:  ROBOTIC ASSISTED TOTAL HYSTERECTOMY - with Left Salpingo-Oophorectomy; CYSTOSCOPY  Patient is awake and responsive. Pain and nausea are reasonably well controlled. Vital signs are stable and clinically acceptable. Oxygen saturation is clinically acceptable. There are no apparent anesthetic complications at this time. Patient is ready for discharge.

## 2011-11-19 NOTE — Op Note (Signed)
Preoperative diagnosis: Postmenopausal bleeding, known uterine fibroids Postoperative diagnosis same, path pending probable endometriosis Procedure robotic assisted total laparoscopic hysterectomy and left salpingo-oophorectomy.  Right tube and ovary were absent at time of surgery. Surgeon: Dr. Meredeth Ide Assistant Dr. Leda Quail Anesthesia: Gen. endotracheal Estimated blood loss: 200 cc Complications: None Procedure: The patient was taken to the operating room and after the induction of adequate general endotracheal anesthesia was placed in the dorsolithotomy position positioned, prepped, and draped appropriately.  Anterior and posterior Deaver retractors were placed into the vagina to enable visualization of the cervix.  A posterior weighted retractor could not be used because of the narrow caliber of the vagina.  The cervix was grasped on its anterior lip with a single-tooth tenaculum.  The sound would not pass through the os.  A small Pratt dilator was then used to dilate the os up to a [employer].  The uterus then sounded to 10 cm.  A 10 cm Rumi manipulator with a small Koh ring was then placed without difficulty.  The balloon tip was inflated.  A Foley catheter was placed.  Attention was next turned to the abdomen.  A small nick was made in the umbilicus and a varies needle was inserted into the peritoneal space.  Proper placement a varies needle was assessed with the hanging drop technique.  Pneumoperitoneum was then created with 2 L of CO2 using the automatic insufflator with a maximum pressure set at 15.  Side and the left upper quadrant midclavicular line was infiltrated with dilute ropivacaine and a 5 mm trocar inserted into the peritoneal space.  Placement was documented with the laparoscope.  Assessment of the abdomen showed that the uterus was mobile.  The right tube and ovary were absent.  The patient had not given a history of previous surgery.  Out however no tube and ovary on that  side could be identified.  There was a 4 cm fibroid in the broad ligament right ovary the uterine artery on the right.  On the left tube and ovary were adherent to the pelvic sidewall and the posterior aspect of the uterus and contained powder burn areas consistent with endometriosis.  60 cc of dilute ropivacaine solution was then inserted into the pelvis.  The solution was made by mixing 60 cc of half percent ropivacaine with 60 cc of sterile water.  The skin just inside the umbilicus was infiltrated with dilute ropivacaine solution incised with a knife and a 12 mm bladed trocar was inserted into the peritoneal space under direct visualization.  The planned sites for the robotic ports were then marked on the abdomen and transilluminated.  The skin over the planned sites was injected with the dilute ropivacaine solution, incised with a knife, and then the 8 mm trochars were inserted under direct visualization.  These ports were placed to the right and the left of the umbilicus in line with the anterior superior iliac spine.  The robot was then brought in and talked from the side on the patient's right.  The monopolar scissors were then placed through port one in the PK gyrus to port 2 and these were placed under direct visualization.  The surgeon then proceeded to the consult.  Inspection of the pelvis revealed the uterus to be globular and enlarged to about 10 weeks size in this nulligravida patient.  There was the aforementioned 4 cm broad ligament fibroid on the patient's right.  Again no tube or ovary could be seen on the patient's  right.  The appendix was adherent to the pelvic sidewall lower in the pelvis then where it normally sits, consistent with previous surgery.  The appendix however appeared normal.  The upper abdomen likewise appeared normal.  The anterior posterior cul-de-sacs were clear.  There was however a cystic structure along the posterior aspect of the uterus on the patient's right and was  approximately 5 cm in diameter but was loose as the consistency of adequate partially filled cyst.  Besides this the ovary was adherent to the uterus posteriorly and to the pelvic sidewall.  The procedure began by identifying the ureters bilaterally.  On the patient's left the round ligament was cauterized and cut and the broad ligament was opened infundibulopelvic ligament was skeletonized and a window was created under the infundibulopelvic ligament after clearly identifying the course of the ureter on the left.  The infundibulopelvic ligament was then cauterized multiple times and cut.  The ureter could be seen coursing through the pelvis and it came very close to the sites where the ovary was adherent to the uterus it required meticulous dissection to free the ovary from the sidewall without injuring the ureter.  The course of the ureter was identified multiple times as the dissection was done.  Partially filled cyst on the back of the uterus was also dissected off.  After a prolonged dissection, the ovary was elevated and the uterine artery could be skeletonized.  The peritoneum was taken down over the cervix and the bladder flap created.  Bladder was dissected well off the cervix.  The left uterine artery was identified and cauterized multiple times and cut.  The procedure then returned to the patient's right side.  Again the ureter was identified.  The peritoneum over the fibroid was incised with monopolar cautery and stripped off the fibroid.  The uterine artery could not be seen as it appeared to be directly underneath the fibroid.  Dissection was done to loosen the fibroid from its attachments to the uterus primarily by dissecting it off the posterior surface of the uterus.  The fibroid could then be elevated enough that the uterine artery could be visualized right at the [facility] ring.  The uterine artery was cauterized multiple times and then cut.  Circumferential colpotomy incision was then made with  monopolar cautery.  An attempt was made to remove the specimen through the vagina intact however it was too large.  Therefore the 4 cm fibroid was cut off the body of the uterus using monopolar cautery,  and then the specimen could be removed through the vagina.  The fibroid and the left tube and ovaries were likewise placed into the vagina and then removed.  Uterine weight in the OR was 251.6 g.  The pelvis was then irrigated and was felt to be free of bleeding.  The robotic instruments were then changed out for a mega-suture cut and port one and a long-tipped forceps in port 2.  Vaginal cuff was then closed with a running V. LOC suture of 0 Vicryl.  Good hemostasis was noted.  A sheet of Interceed was placed over the vaginal cuff.  Robotic instruments were then removed.  The robot was undocked and taken away.  The surgeon proceeded to cystoscopy.  300 cc of sterile water was placed into the bladder.  Indigo carmine had been giving given IV during the vaginal cuff closure and indigo carmine could clearly be seen emanating from both ureteral orifices.  The Foley catheter was then reinserted to drain  the bladder.  Attention was turned next back to the abdomen the fascia at the umbilicus was closed with a figure-of-eight suture of 0 Vicryl.  The skin of all 4 incisions was closed with 3-0 Vicryl Rapide, and Dermabond.  Sponge needle and instrument counts were correct.  The patient was taken to the recovery room in satisfactory condition.

## 2011-11-19 NOTE — Anesthesia Postprocedure Evaluation (Signed)
  Anesthesia Post-op Note  Patient: [name]  Procedure(s) Performed:  ROBOTIC ASSISTED TOTAL HYSTERECTOMY - with Left Salpingo-Oophorectomy; CYSTOSCOPY  Patient Location: PACU and Women's Unit  Anesthesia Type: General  Level of Consciousness: awake, alert  and oriented  Airway and Oxygen Therapy: Patient Spontanous Breathing  Post-op Pain: none  Post-op Assessment: Post-op Vital signs reviewed  Post-op Vital Signs: Reviewed and stable  Complications: No apparent anesthesia complications

## 2011-11-19 NOTE — Anesthesia Procedure Notes (Signed)
Procedure Name: Intubation Date/Time: 11/19/2011 7:36 AM Performed by: Marrion Coy Pre-anesthesia Checklist: Patient identified, Patient being monitored, Emergency Drugs available, Timeout performed and Suction available Patient Re-evaluated:Patient Re-evaluated prior to inductionOxygen Delivery Method: Circle System Utilized Preoxygenation: Pre-oxygenation with 100% oxygen Intubation Type: IV induction Ventilation: Mask ventilation without difficulty Grade View: Grade I Tube type: Oral Number of attempts: 1 Placement Confirmation: ETT inserted through vocal cords under direct vision Secured at: 21 cm

## 2011-11-20 ENCOUNTER — Encounter (HOSPITAL_COMMUNITY): Payer: Self-pay | Admitting: Obstetrics and Gynecology

## 2011-11-29 ENCOUNTER — Encounter: Payer: Self-pay | Admitting: Family Medicine

## 2011-11-29 ENCOUNTER — Ambulatory Visit (INDEPENDENT_AMBULATORY_CARE_PROVIDER_SITE_OTHER): Payer: BC Managed Care – PPO | Admitting: Family Medicine

## 2011-11-29 VITALS — BP 130/84 | HR 76 | Temp 98.7°F | Ht 62.0 in | Wt 130.4 lb

## 2011-11-29 DIAGNOSIS — E876 Hypokalemia: Secondary | ICD-10-CM

## 2011-11-29 DIAGNOSIS — I10 Essential (primary) hypertension: Secondary | ICD-10-CM

## 2011-11-29 MED ORDER — LISINOPRIL-HYDROCHLOROTHIAZIDE 20-12.5 MG PO TABS: 1.0000 | ORAL_TABLET | Freq: Every day | ORAL | Status: DC

## 2011-11-29 NOTE — Assessment & Plan Note (Addendum)
New diagnosis. Eval for end organ damage, risk factors and secondary causes. Reviewed recent labs, EKG nml pre-op reviewed. UA today showed no protein in urine.  WIll send for lipids, DM screen, potassium recheck and TSH. Other labs completed. Given BP not controlled.. Change HCTZ to lisinopril HCTZ daily. Follow up in 1 month.

## 2011-11-29 NOTE — Assessment & Plan Note (Signed)
Mild, prior to surgery. HAs been on potassium supplement since. Will recehcvk.. May be able to simply stop supplement and increase potassium rich foods.

## 2011-11-29 NOTE — Patient Instructions (Addendum)
Turn for fasting labs early next week. Start lisinopril/HCTZ daily. Work on low salt heart healthy diet. Work on regular exercise routine. 3-5 times a week.  Follow BP at home, call if greater than 140/90 regularly on new medicaiton.  Follow up in 1 month for BP check.

## 2011-11-29 NOTE — Progress Notes (Signed)
  Subjective:    Patient ID: [name], female    DOB: 11-25-1961, 50 y.o.   MRN: [id number]  HPI  50 year old female presents with recent BP elevations. At appt for hysterectomy.. 172/108. She was having arm numbness, now resolved on new med. No headache, no blurred vision. She was started HCTZ 25 mg daily. She has not had any SE to this medication.  No chest pain, no SOB, no edema.  EKG, Cr,cbc, liver function was normal prior to surgery.    Recent BPs on med at home 130/79-155/97.  Father with HTN, DM, mother died early...possible CVA, MI   Review of Systems  Constitutional: Negative for fever and fatigue.  HENT: Negative for ear pain.   Eyes: Negative for pain.  Respiratory: Negative for chest tightness and shortness of breath.   Cardiovascular: Negative for chest pain, palpitations and leg swelling.  Gastrointestinal: Negative for abdominal pain.  Genitourinary: Negative for dysuria.       Objective:   Physical Exam  Constitutional: Vital signs are normal. She appears well-developed and well-nourished. She is cooperative.  Non-toxic appearance. She does not appear ill. No distress.  HENT:  Head: Normocephalic.  Right Ear: Hearing, tympanic membrane, external ear and ear canal normal. Tympanic membrane is not erythematous, not retracted and not bulging.  Left Ear: Hearing, tympanic membrane, external ear and ear canal normal. Tympanic membrane is not erythematous, not retracted and not bulging.  Nose: No mucosal edema or rhinorrhea. Right sinus exhibits no maxillary sinus tenderness and no frontal sinus tenderness. Left sinus exhibits no maxillary sinus tenderness and no frontal sinus tenderness.  Mouth/Throat: Uvula is midline, oropharynx is clear and moist and mucous membranes are normal.  Eyes: Conjunctivae, EOM and lids are normal. Pupils are equal, round, and reactive to light. No foreign bodies found.  Fundoscopic exam:      The right eye shows no arteriolar  narrowing, no AV nicking and no papilledema.       The left eye shows no arteriolar narrowing, no AV nicking and no papilledema.  Neck: Trachea normal and normal range of motion. Neck supple. Carotid bruit is not present. No mass and no thyromegaly present.  Cardiovascular: Normal rate, regular rhythm, S1 normal, S2 normal, normal heart sounds, intact distal pulses and normal pulses.  Exam reveals no gallop and no friction rub.   No murmur heard. Pulmonary/Chest: Effort normal and breath sounds normal. Not tachypneic. No respiratory distress. She has no decreased breath sounds. She has no wheezes. She has no rhonchi. She has no rales.  Abdominal: Soft. Normal appearance and bowel sounds are normal. There is no tenderness.  Neurological: She is alert.  Skin: Skin is warm, dry and intact. No rash noted.  Psychiatric: Her speech is normal and behavior is normal. Judgment and thought content normal. Her mood appears not anxious. Cognition and memory are normal. She does not exhibit a depressed mood.          Assessment & Plan:

## 2011-12-02 ENCOUNTER — Other Ambulatory Visit (INDEPENDENT_AMBULATORY_CARE_PROVIDER_SITE_OTHER): Payer: BC Managed Care – PPO

## 2011-12-02 DIAGNOSIS — I10 Essential (primary) hypertension: Secondary | ICD-10-CM

## 2011-12-02 LAB — BASIC METABOLIC PANEL
BUN: 22 mg/dL (ref 6–23)
CO2: 27 mEq/L (ref 19–32)
Chloride: 103 mEq/L (ref 96–112)
Creatinine, Ser: 0.8 mg/dL (ref 0.4–1.2)
Glucose, Bld: 100 mg/dL — ABNORMAL HIGH (ref 70–99)
Potassium: 3.8 mEq/L (ref 3.5–5.1)

## 2011-12-02 LAB — LIPID PANEL
Cholesterol: 223 mg/dL — ABNORMAL HIGH (ref 0–200)
Total CHOL/HDL Ratio: 4
VLDL: 33.6 mg/dL (ref 0.0–40.0)

## 2011-12-02 LAB — LDL CHOLESTEROL, DIRECT: Direct LDL: 135.9 mg/dL

## 2011-12-04 ENCOUNTER — Encounter: Payer: Self-pay | Admitting: *Deleted

## 2012-01-22 ENCOUNTER — Telehealth: Payer: Self-pay

## 2012-01-22 MED ORDER — LOSARTAN POTASSIUM-HCTZ 50-12.5 MG PO TABS: 1.0000 | ORAL_TABLET | Freq: Every day | ORAL | Status: DC

## 2012-01-22 NOTE — Telephone Encounter (Signed)
May change to hyzaar.  Will need to keep eye on bp and return in 3-4 wks for f/u. Will route to PCP in case she wants anything different

## 2012-01-22 NOTE — Telephone Encounter (Signed)
Patient notified. She will keep check on BP and follow up scheduled with Dr. Reece Agar as Dr. B will not be in the office.

## 2012-01-22 NOTE — Telephone Encounter (Signed)
Patient called complaining that her high blood pressure medication, Lisinopril, is causing her to have a dry cough.  She would like to change it to something else.  Please contact patient to discuss.

## 2012-02-11 ENCOUNTER — Encounter: Payer: Self-pay | Admitting: Family Medicine

## 2012-02-11 ENCOUNTER — Ambulatory Visit (INDEPENDENT_AMBULATORY_CARE_PROVIDER_SITE_OTHER): Payer: BC Managed Care – PPO | Admitting: Family Medicine

## 2012-02-11 VITALS — BP 138/84 | HR 80 | Temp 98.4°F | Wt 129.2 lb

## 2012-02-11 DIAGNOSIS — I10 Essential (primary) hypertension: Secondary | ICD-10-CM

## 2012-02-11 LAB — BASIC METABOLIC PANEL
BUN: 15 mg/dL (ref 6–23)
GFR: 108.34 mL/min (ref >60.00)
Potassium: 3.5 mEq/L (ref 3.5–5.1)

## 2012-02-11 NOTE — Progress Notes (Signed)
  Subjective:    Patient ID: [name], female    DOB: 09/29/1961, 51 y.o.   MRN: [id number]  HPI CC: f/u HTN  A few weeks ago called because ACEI causing dry cough.  This was changed to hyzaar and cough quickly resolved.  No HA, vision changes, CP/tightness, SOB, leg swelling.   bp at home running 108/80s.  Denies dizziness, lightheaded, etc.  Today actually high for her because has been rushing to get here today.  No upcoming appt with Dr. Leonard Schwartz.  Stays away from salt, gets good water intake.  Some fruits/vegetables daily.  BP Readings from Last 3 Encounters:  02/11/12 138/84  11/29/11 130/84  11/19/11 145/81    Melasma - bothering her.  Has seen dermatologist and OBGYN (on vivelle dot and progesterone).  Unsure what else to do.  Review of Systems Per HPI    Objective:   Physical Exam  Nursing note and vitals reviewed. Constitutional: She appears well-developed and well-nourished. No distress.  HENT:  Head: Normocephalic and atraumatic.  Mouth/Throat: Oropharynx is clear and moist. No oropharyngeal exudate.  Eyes: Conjunctivae and EOM are normal. Pupils are equal, round, and reactive to light. No scleral icterus.  Neck: Normal range of motion. Neck supple. Carotid bruit is not present.  Cardiovascular: Normal rate, regular rhythm, normal heart sounds and intact distal pulses.   No murmur heard. Pulmonary/Chest: Effort normal and breath sounds normal. No respiratory distress. She has no wheezes. She has no rales.  Abdominal: Bowel sounds are normal. There is no tenderness.       No abd/renal bruits  Musculoskeletal: She exhibits no edema.  Lymphadenopathy:    She has no cervical adenopathy.  Skin: Skin is warm and dry. No rash noted.       Assessment & Plan:

## 2012-02-11 NOTE — Patient Instructions (Addendum)
I'm glad hyzaar is doing well. Good to see you today, call us with questions. Blood work today. Return as needed.

## 2012-02-11 NOTE — Assessment & Plan Note (Signed)
Check Cr and K today. Good control on current regimen per home readings. rtc as needed.

## 2012-03-18 ENCOUNTER — Encounter: Payer: Self-pay | Admitting: Internal Medicine

## 2012-04-01 ENCOUNTER — Encounter: Payer: Self-pay | Admitting: Internal Medicine

## 2012-04-03 ENCOUNTER — Ambulatory Visit (INDEPENDENT_AMBULATORY_CARE_PROVIDER_SITE_OTHER): Payer: BC Managed Care – PPO | Admitting: Family Medicine

## 2012-04-03 ENCOUNTER — Encounter: Payer: Self-pay | Admitting: Family Medicine

## 2012-04-03 VITALS — BP 120/78 | HR 75 | Temp 98.3°F | Ht 62.5 in | Wt 128.8 lb

## 2012-04-03 DIAGNOSIS — M75100 Unspecified rotator cuff tear or rupture of unspecified shoulder, not specified as traumatic: Secondary | ICD-10-CM

## 2012-04-03 DIAGNOSIS — M7582 Other shoulder lesions, left shoulder: Secondary | ICD-10-CM

## 2012-04-03 MED ORDER — DICLOFENAC SODIUM 75 MG PO TBEC: 75.0000 mg | DELAYED_RELEASE_TABLET | Freq: Two times a day (BID) | ORAL | Status: DC

## 2012-04-03 NOTE — Patient Instructions (Signed)
Start rotator cuff tendonitits exercises daily to three times daily.  Take dicolfenac twice daily x 3-4 days then use as needed.  If not improving in 2 weeks, make appt for re-eval.

## 2012-04-03 NOTE — Progress Notes (Signed)
  Subjective:    Patient ID: [name], female    DOB: 1961/10/02, 51 y.o.   MRN: [id number]  HPI  51 year old female presents with several months of left arm pain. When raise left arm.. Pain in shoulder down to elbow. Pain with int and ext rotation..taking off shirt and buckling seat belt. No weakness. Occ numbness, occ tingling into fingers when she pushes against wall. No neck pain or pain with moving head. No clicking or popping. No falls or known injury recently, 1 year ago was trimming trees above head Aleve not helping.  Sees chiropractor monthly.  Review of Systems  Constitutional: Negative for fever and fatigue.  Respiratory: Negative for shortness of breath.   Cardiovascular: Negative for chest pain.       Objective:   Physical Exam  Constitutional: She appears well-developed and well-nourished.  Eyes: Conjunctivae are normal. Pupils are equal, round, and reactive to light.  Neck: Normal range of motion. Neck supple. No thyromegaly present.  Cardiovascular: Normal rate, regular rhythm, normal heart sounds and intact distal pulses.  Exam reveals no gallop and no friction rub.   No murmur heard. Pulmonary/Chest: Effort normal and breath sounds normal. No respiratory distress. She has no wheezes. She has no rales. She exhibits no tenderness.  Musculoskeletal:       Right shoulder: Normal.       Left shoulder: She exhibits decreased range of motion, tenderness and pain. She exhibits no bony tenderness, no swelling, no effusion, no deformity and normal strength.       Positive impingement test  Pain with ext rotation and abduction of left shoulder Neg Spurling, full ROM of neck. No focal trapezius ttp          Assessment & Plan:

## 2012-04-06 DIAGNOSIS — M7582 Other shoulder lesions, left shoulder: Secondary | ICD-10-CM | POA: Insufficient documentation

## 2012-04-06 NOTE — Assessment & Plan Note (Signed)
Treat with NSAIDs, heat, gentle ROM exercises then strengthening exercises. Info given.  Call if not improve in 2 weeks for consideration of PT, X-ray etc. Pt very hesitant about shoulder injections.

## 2012-09-02 ENCOUNTER — Telehealth: Payer: Self-pay

## 2012-09-02 NOTE — Telephone Encounter (Signed)
Dr Ermalene Searing request pt appt to discuss labs. Patient notified as instructed by telephone. Front desk scheduling appt.

## 2012-09-11 ENCOUNTER — Encounter: Payer: Self-pay | Admitting: Family Medicine

## 2012-09-11 ENCOUNTER — Ambulatory Visit (INDEPENDENT_AMBULATORY_CARE_PROVIDER_SITE_OTHER): Payer: BC Managed Care – PPO | Admitting: Family Medicine

## 2012-09-11 VITALS — BP 106/70 | HR 74 | Temp 98.0°F | Resp 20 | Ht 62.5 in | Wt 122.5 lb

## 2012-09-11 DIAGNOSIS — E785 Hyperlipidemia, unspecified: Secondary | ICD-10-CM

## 2012-09-11 NOTE — Progress Notes (Signed)
  Subjective:    Patient ID: [name], female    DOB: [birth year], 51 y.o.   MRN: [id number]  HPI 51 year old female presents to discuss cholesterol. Low HDL, no HTN, nonsmoker, >age 21, strong family history CAD Goal LDL<130.  Elevated Cholesterol: Not at goal <130. Total 9/13   264 HDL  9/13  46 Tri     9/13  309 last year 168 LDL  9/13   156 last year 308  She has tried red yeast rice, not sure why she stopped. Increase in stress lately. Diet compliance: Moderate, Exercise: Limited in last few months, used to walk 2 miles 5 days a week. Other complaints:     Review of Systems  Constitutional: Negative for fever and fatigue.  HENT: Negative for ear pain.   Eyes: Negative for pain.  Respiratory: Negative for chest tightness and shortness of breath.   Cardiovascular: Negative for chest pain, palpitations and leg swelling.  Gastrointestinal: Negative for abdominal pain.  Genitourinary: Negative for dysuria.       Objective:   Physical Exam  Constitutional: Vital signs are normal. She appears well-developed and well-nourished. She is cooperative.  Non-toxic appearance. She does not appear ill. No distress.  HENT:  Right Ear: Hearing, tympanic membrane and ear canal normal. Tympanic membrane is not erythematous, not retracted and not bulging.  Left Ear: Hearing, tympanic membrane and ear canal normal. Tympanic membrane is not erythematous, not retracted and not bulging.  Nose: No mucosal edema or rhinorrhea. Right sinus exhibits no maxillary sinus tenderness and no frontal sinus tenderness. Left sinus exhibits no maxillary sinus tenderness and no frontal sinus tenderness.  Mouth/Throat: Uvula is midline and mucous membranes are normal.  Eyes: Lids are normal. No foreign bodies found.  Neck: Trachea normal and normal range of motion. Neck supple. Carotid bruit is not present. No mass and no thyromegaly present.  Cardiovascular: Normal rate, regular rhythm, S1 normal, S2  normal, normal heart sounds, intact distal pulses and normal pulses.  Exam reveals no gallop and no friction rub.   No murmur heard. Pulmonary/Chest: Effort normal and breath sounds normal. Not tachypneic. No respiratory distress. She has no decreased breath sounds. She has no wheezes. She has no rhonchi. She has no rales.  Abdominal: Soft. Normal appearance and bowel sounds are normal. There is no tenderness.  Neurological: She is alert.  Skin: Skin is warm, dry and intact. No rash noted.  Psychiatric: Her speech is normal and behavior is normal. Judgment and thought content normal. Her mood appears not anxious. Cognition and memory are normal. She does not exhibit a depressed mood.          Assessment & Plan:

## 2012-09-11 NOTE — Assessment & Plan Note (Signed)
INfo given. The patient is advised to get back on track with exercise, reviewed diet changes in detail.  Start red yeast rice and increase fish oil. Recheck in 3 months.

## 2012-09-11 NOTE — Patient Instructions (Addendum)
Restart red yeast rice 2400 mg daily. Start fish/flax oil.. Make sure taking 2000 mg daily. Get abck on track with diet and exercsie.  Return in 3 months for cholesterol recheck.

## 2012-09-16 ENCOUNTER — Other Ambulatory Visit: Payer: BC Managed Care – PPO

## 2012-10-16 ENCOUNTER — Other Ambulatory Visit: Payer: Self-pay | Admitting: Obstetrics and Gynecology

## 2012-10-16 DIAGNOSIS — Z1231 Encounter for screening mammogram for malignant neoplasm of breast: Secondary | ICD-10-CM

## 2012-10-27 ENCOUNTER — Other Ambulatory Visit: Payer: Self-pay | Admitting: Family Medicine

## 2012-11-19 ENCOUNTER — Ambulatory Visit (HOSPITAL_COMMUNITY)
Admission: RE | Admit: 2012-11-19 | Discharge: 2012-11-19 | Disposition: A | Discharge status: Home / Self Care | Payer: BC Managed Care – PPO | Source: Ambulatory Visit | Attending: Obstetrics and Gynecology | Admitting: Obstetrics and Gynecology

## 2012-11-19 DIAGNOSIS — Z1231 Encounter for screening mammogram for malignant neoplasm of breast: Secondary | ICD-10-CM | POA: Insufficient documentation

## 2012-12-11 ENCOUNTER — Other Ambulatory Visit: Payer: BC Managed Care – PPO

## 2012-12-16 ENCOUNTER — Other Ambulatory Visit: Payer: Self-pay | Admitting: Family Medicine

## 2012-12-18 ENCOUNTER — Other Ambulatory Visit (INDEPENDENT_AMBULATORY_CARE_PROVIDER_SITE_OTHER): Payer: BC Managed Care – PPO

## 2012-12-18 DIAGNOSIS — E785 Hyperlipidemia, unspecified: Secondary | ICD-10-CM

## 2012-12-18 DIAGNOSIS — R5383 Other fatigue: Secondary | ICD-10-CM

## 2012-12-18 DIAGNOSIS — R5381 Other malaise: Secondary | ICD-10-CM

## 2012-12-18 LAB — COMPREHENSIVE METABOLIC PANEL
ALT: 14 U/L (ref 0–35)
AST: 16 U/L (ref 0–37)
Albumin: 4.3 g/dL (ref 3.5–5.2)
Alkaline Phosphatase: 43 U/L (ref 39–117)
BUN: 15 mg/dL (ref 6–23)
Chloride: 103 mEq/L (ref 96–112)
Creatinine, Ser: 0.7 mg/dL (ref 0.4–1.2)
Potassium: 4 mEq/L (ref 3.5–5.1)

## 2012-12-18 LAB — LIPID PANEL
HDL: 46.1 mg/dL (ref >39.00)
Total CHOL/HDL Ratio: 5
VLDL: 39 mg/dL (ref 0.0–40.0)

## 2012-12-18 LAB — TSH: TSH: 1.22 u[IU]/mL (ref 0.35–5.50)

## 2012-12-21 ENCOUNTER — Encounter: Payer: Self-pay | Admitting: *Deleted

## 2013-01-15 ENCOUNTER — Other Ambulatory Visit: Payer: Self-pay | Admitting: Family Medicine

## 2013-01-16 ENCOUNTER — Other Ambulatory Visit: Payer: Self-pay

## 2013-03-10 ENCOUNTER — Encounter: Payer: Self-pay | Admitting: Internal Medicine

## 2013-03-11 ENCOUNTER — Encounter: Payer: Self-pay | Admitting: Internal Medicine

## 2013-07-15 ENCOUNTER — Other Ambulatory Visit: Payer: Self-pay | Admitting: Family Medicine

## 2013-08-12 ENCOUNTER — Other Ambulatory Visit: Payer: Self-pay | Admitting: Family Medicine

## 2013-08-12 NOTE — Telephone Encounter (Signed)
Last office visit 09/13/2012.  Ok to refill?  Was given #30 with no refills in 07/15/2013.

## 2013-08-12 NOTE — Telephone Encounter (Signed)
Have her schedule CPX with labs prior and refill until then, please.

## 2013-08-13 NOTE — Telephone Encounter (Signed)
Left message on cell phone for patient to return my call. 

## 2013-08-13 NOTE — Telephone Encounter (Signed)
CPX scheduled for 08/27/2013 @ 8:15am.  Refill sent to pharmacy for one month.

## 2013-08-24 ENCOUNTER — Telehealth: Payer: Self-pay | Admitting: Family Medicine

## 2013-08-24 DIAGNOSIS — E876 Hypokalemia: Secondary | ICD-10-CM

## 2013-08-24 DIAGNOSIS — E785 Hyperlipidemia, unspecified: Secondary | ICD-10-CM

## 2013-08-24 NOTE — Telephone Encounter (Signed)
Message copied by Excell Seltzer on Tue Aug 24, 2013  4:48 PM ------      Message from: Alvina Chou      Created: Mon Aug 23, 2013  3:54 PM      Regarding: Lab orders for Wednesday, 9.24.14       Patient is scheduled for CPX labs, please order future labs, Thanks , Terri       ------

## 2013-08-25 ENCOUNTER — Other Ambulatory Visit (INDEPENDENT_AMBULATORY_CARE_PROVIDER_SITE_OTHER): Payer: BC Managed Care – PPO

## 2013-08-25 DIAGNOSIS — E785 Hyperlipidemia, unspecified: Secondary | ICD-10-CM

## 2013-08-25 DIAGNOSIS — E876 Hypokalemia: Secondary | ICD-10-CM

## 2013-08-25 DIAGNOSIS — I10 Essential (primary) hypertension: Secondary | ICD-10-CM

## 2013-08-25 LAB — COMPREHENSIVE METABOLIC PANEL
Albumin: 4.3 g/dL (ref 3.5–5.2)
BUN: 19 mg/dL (ref 6–23)
CO2: 30 mEq/L (ref 19–32)
GFR: 95.17 mL/min (ref >60.00)
Glucose, Bld: 95 mg/dL (ref 70–99)
Potassium: 3.9 mEq/L (ref 3.5–5.1)
Sodium: 138 mEq/L (ref 135–145)
Total Protein: 7.3 g/dL (ref 6.0–8.3)

## 2013-08-25 LAB — LDL CHOLESTEROL, DIRECT: Direct LDL: 126.8 mg/dL

## 2013-08-25 LAB — LIPID PANEL
HDL: 46.3 mg/dL (ref >39.00)
Total CHOL/HDL Ratio: 5
Triglycerides: 229 mg/dL — ABNORMAL HIGH (ref 0.0–149.0)

## 2013-08-27 ENCOUNTER — Encounter: Payer: BC Managed Care – PPO | Admitting: Family Medicine

## 2013-08-27 ENCOUNTER — Ambulatory Visit (INDEPENDENT_AMBULATORY_CARE_PROVIDER_SITE_OTHER): Payer: BC Managed Care – PPO | Admitting: Family Medicine

## 2013-08-27 ENCOUNTER — Encounter: Payer: Self-pay | Admitting: Family Medicine

## 2013-08-27 VITALS — BP 104/70 | HR 78 | Temp 98.4°F | Ht 62.5 in | Wt 122.5 lb

## 2013-08-27 DIAGNOSIS — Z0000 Encounter for general adult medical examination without abnormal findings: Secondary | ICD-10-CM

## 2013-08-27 DIAGNOSIS — IMO0001 Reserved for inherently not codable concepts without codable children: Secondary | ICD-10-CM | POA: Insufficient documentation

## 2013-08-27 DIAGNOSIS — E785 Hyperlipidemia, unspecified: Secondary | ICD-10-CM

## 2013-08-27 DIAGNOSIS — Z23 Encounter for immunization: Secondary | ICD-10-CM

## 2013-08-27 DIAGNOSIS — H538 Other visual disturbances: Secondary | ICD-10-CM

## 2013-08-27 DIAGNOSIS — Z1212 Encounter for screening for malignant neoplasm of rectum: Secondary | ICD-10-CM

## 2013-08-27 DIAGNOSIS — R61 Generalized hyperhidrosis: Secondary | ICD-10-CM

## 2013-08-27 DIAGNOSIS — I10 Essential (primary) hypertension: Secondary | ICD-10-CM

## 2013-08-27 NOTE — Assessment & Plan Note (Signed)
Well controlled. Continue current medication.  

## 2013-08-27 NOTE — Assessment & Plan Note (Signed)
Pt felt excessive. No associated symptoms. Occuring during summer. Continue to follow. TSH nml 1.2014

## 2013-08-27 NOTE — Progress Notes (Signed)
Subjective:    Patient ID: [name], female    DOB: [birth year], 52 y.o.   MRN: [id number]  HPI  The patient is here for annual wellness exam and preventative care.   She sees GYN for CPX/pelvic.  She has noted a lot of sweating with walking outside.. More than she usually does. Noted in last 3-4 months. She is on vivelle given TAH. She occ notes blurry vision in last 3-4 months. Lasts 15-20 min, improves with getting up a walking around. NO association with other symptoms. Has not seen eye MD.  No fatgiue, no new major headaches.  Nml thyroid in 12/2012. ? Family history  Elevated Cholesterol: On red yeast rice and flax seed... LDL at goal < 130, trig elevated Lab Results  Component Value Date   CHOL 216* 08/25/2013   HDL 46.30 08/25/2013   LDLDIRECT 126.8 08/25/2013   TRIG 229.0* 08/25/2013   CHOLHDL 5 08/25/2013  CAD Risks: Low HDL, no HTN, nonsmoker, >age 89, strong family history CAD Using medications without problems: Muscle aches: None Diet compliance:Moderate Exercise:walks. Other complaints:  Hypertension:  Well controlled on losartan HCTZ BP Readings from Last 3 Encounters:  09/11/12 106/70  04/03/12 120/78  02/11/12 138/84  Using medication without problems or lightheadedness: None Chest pain with exertion:None Edema:None Short of breath:None Average home BPs:110/70 Other issues:   Review of Systems  Constitutional: Negative for fever, fatigue and unexpected weight change.  HENT: Negative for ear pain, congestion, sore throat, sneezing, trouble swallowing and sinus pressure.   Eyes: Negative for pain and itching.  Respiratory: Negative for cough, shortness of breath and wheezing.   Cardiovascular: Negative for chest pain, palpitations and leg swelling.  Gastrointestinal: Positive for constipation. Negative for nausea, abdominal pain, diarrhea and blood in stool.  Genitourinary: Negative for dysuria, hematuria, vaginal discharge, difficulty urinating and  menstrual problem.  Skin: Negative for rash.  Neurological: Negative for syncope, weakness, light-headedness, numbness and headaches.  Psychiatric/Behavioral: Negative for confusion and dysphoric mood. The patient is not nervous/anxious.        Objective:   Physical Exam  Constitutional: Vital signs are normal. She appears well-developed and well-nourished. She is cooperative.  Non-toxic appearance. She does not appear ill. No distress.  HENT:  Head: Normocephalic.  Right Ear: Hearing, tympanic membrane, external ear and ear canal normal.  Left Ear: Hearing, tympanic membrane, external ear and ear canal normal.  Nose: Nose normal.  Eyes: Conjunctivae, EOM and lids are normal. Pupils are equal, round, and reactive to light. Lids are everted and swept, no foreign bodies found.  Fundoscopic exam:      The right eye shows no hemorrhage and no papilledema.       The left eye shows no hemorrhage and no papilledema.  Neck: Trachea normal and normal range of motion. Neck supple. Carotid bruit is not present. No mass and no thyromegaly present.  Cardiovascular: Normal rate, regular rhythm, S1 normal, S2 normal, normal heart sounds and intact distal pulses.  Exam reveals no gallop.   No murmur heard. Pulmonary/Chest: Effort normal and breath sounds normal. No respiratory distress. She has no wheezes. She has no rhonchi. She has no rales.  Abdominal: Soft. Normal appearance and bowel sounds are normal. She exhibits no distension, no fluid wave, no abdominal bruit and no mass. There is no hepatosplenomegaly. There is no tenderness. There is no rebound, no guarding and no CVA tenderness. No hernia.  Lymphadenopathy:    She has no cervical adenopathy.    She  has no axillary adenopathy.  Neurological: She is alert. She has normal strength and normal reflexes. No cranial nerve deficit or sensory deficit. She displays a negative Romberg sign.  Skin: Skin is warm, dry and intact. No rash noted.   Psychiatric: Her speech is normal and behavior is normal. Judgment normal. Her mood appears not anxious. Cognition and memory are normal. She does not exhibit a depressed mood.          Assessment & Plan:  The patient's preventative maintenance and recommended screening tests for an annual wellness exam were reviewed in full today. Brought up to date unless services declined.  Counselled on the importance of diet, exercise, and its role in overall health and mortality. The patient's FH and SH was reviewed, including their home life, tobacco status, and drug and alcohol status.   Vaccines: given flu, uptodate with TD. Colon: 2003 Dr. Juanda Chance, nml, recommend repeat in 10 years.. Given she cannot do this now we will do IFOB yearly. Mammo:10/2012, breast exam per GYN Pap/DVE:  TAH 2012, followed per GYN

## 2013-08-27 NOTE — Assessment & Plan Note (Signed)
Intermittant. Likely eye strain. Nm neuro exam. Recommend opthalmology exam to determine if vision changing as ages.

## 2013-08-27 NOTE — Assessment & Plan Note (Signed)
Encouraged exercise, weight maintanance, healthy eating habits. Increase red yeast rice.

## 2013-08-27 NOTE — Patient Instructions (Addendum)
Work on increasing exercise. Increase red yeast rice 1200 mg twice daily. Continue eating low fat low carb diet.  Stop at lab on way out for stool test for colon cancer.

## 2013-09-13 ENCOUNTER — Other Ambulatory Visit (HOSPITAL_COMMUNITY): Payer: Self-pay | Admitting: Obstetrics and Gynecology

## 2013-09-13 ENCOUNTER — Other Ambulatory Visit: Payer: Self-pay | Admitting: Family Medicine

## 2013-09-13 NOTE — Telephone Encounter (Signed)
Pt has annual exam scheduled for 09/24/13 with P.Grubb/cm

## 2013-09-22 ENCOUNTER — Encounter: Payer: Self-pay | Admitting: Nurse Practitioner

## 2013-09-23 ENCOUNTER — Encounter: Payer: Self-pay | Admitting: *Deleted

## 2013-09-23 ENCOUNTER — Other Ambulatory Visit (INDEPENDENT_AMBULATORY_CARE_PROVIDER_SITE_OTHER): Payer: BC Managed Care – PPO

## 2013-09-23 DIAGNOSIS — Z1212 Encounter for screening for malignant neoplasm of rectum: Secondary | ICD-10-CM

## 2013-09-23 LAB — FECAL OCCULT BLOOD, IMMUNOCHEMICAL: Fecal Occult Bld: NEGATIVE

## 2013-09-24 ENCOUNTER — Encounter: Payer: Self-pay | Admitting: Nurse Practitioner

## 2013-09-24 ENCOUNTER — Telehealth: Payer: Self-pay | Admitting: Nurse Practitioner

## 2013-09-24 ENCOUNTER — Ambulatory Visit (INDEPENDENT_AMBULATORY_CARE_PROVIDER_SITE_OTHER): Payer: BC Managed Care – PPO | Admitting: Nurse Practitioner

## 2013-09-24 VITALS — BP 120/66 | HR 60 | Resp 16 | Ht 62.25 in | Wt 126.0 lb

## 2013-09-24 DIAGNOSIS — E559 Vitamin D deficiency, unspecified: Secondary | ICD-10-CM

## 2013-09-24 DIAGNOSIS — Z0000 Encounter for general adult medical examination without abnormal findings: Secondary | ICD-10-CM

## 2013-09-24 DIAGNOSIS — Z01419 Encounter for gynecological examination (general) (routine) without abnormal findings: Secondary | ICD-10-CM

## 2013-09-24 LAB — POCT URINALYSIS DIPSTICK
Bilirubin, UA: NEGATIVE
Blood, UA: NEGATIVE
Glucose, UA: NEGATIVE
Leukocytes, UA: NEGATIVE
Nitrite, UA: NEGATIVE
Urobilinogen, UA: NEGATIVE
pH, UA: 7

## 2013-09-24 MED ORDER — VITAMIN D (ERGOCALCIFEROL) 1.25 MG (50000 UNIT) PO CAPS: 50000.0000 [IU] | ORAL_CAPSULE | ORAL | Status: DC

## 2013-09-24 MED ORDER — PROGESTERONE MICRONIZED 200 MG PO CAPS: ORAL_CAPSULE | ORAL | Status: DC

## 2013-09-24 MED ORDER — ESTRADIOL 0.1 MG/24HR TD PTTW: MEDICATED_PATCH | TRANSDERMAL | Status: DC

## 2013-09-24 NOTE — Telephone Encounter (Signed)
S/W pharmacist clarified rx patient is to take Vitamin D 50,000 by mouth daily every 7 days.

## 2013-09-24 NOTE — Patient Instructions (Signed)

## 2013-09-24 NOTE — Telephone Encounter (Signed)
Pharmacy has a question about vitamin D instructions.

## 2013-09-24 NOTE — Progress Notes (Signed)
Patient ID: [name], female   DOB: 1961-01-10, 52 y.o.   MRN: [id number] 52 y.o. G0P0 Single Asian Fe here for annual exam.  doing well since hysterectomy 11/2011.  Same partner for 28 years.  He still lives and [place] of the country.  Patient's last menstrual period was 11/02/2011.          Sexually active: no  The current method of family planning is none.    Exercising: yes  Home exercise routine includes walking 1/2 hour of fast walking 2-3 days per week.  about 2.5 miles per walk.  also does a lot of yard work.. Smoker:  no  Health Maintenance: Pap: 02/28/11, WNL MMG: 11/19/12, BI-RADS 1: negative Colonoscopy: 06/22/02, repeat 10 years TDaP: 08/26/2007 Labs: HB: 13.1 Urine: negative   reports that she has never smoked. She has never used smokeless tobacco. She reports that she does not drink alcohol or use illicit drugs.  Past Medical History  Diagnosis Date  . Hyperlipidemia   . Hypertension     pt dx withi last week. Dr. Tresa Res RX blood pressure med. pt unsure name   . Endometriosis     Past Surgical History  Procedure Laterality Date  . Cystoscopy  11/19/2011    Procedure: CYSTOSCOPY;  Surgeon: Edwena Felty Romine;  Location: [facility];  Service: Gynecology;  Laterality: N/A;  . Robotic assisted lap vaginal hysterectomy Left 11/19/11    vag hyst/LSO    Current Outpatient Prescriptions  Medication Sig Dispense Refill  . Ascorbic Acid (VITAMIN C) 1000 MG tablet Take 1,000 mg by mouth daily.        Marland Kitchen aspirin 81 MG tablet Take 81 mg by mouth daily.      Marland Kitchen b complex vitamins tablet Take 1 tablet by mouth daily.       . Calcium Carbonate-Vit D-Min (CALCIUM 1200) 1200-1000 MG-UNIT CHEW Chew 1 tablet by mouth daily.        Marland Kitchen estradiol (VIVELLE-DOT) 0.1 MG/24HR patch USE ONE PATCH TWO TIMES WEEKLY  24 patch  3  . Flaxseed, Linseed, 1300 MG CAPS Take 2 capsules by mouth 2 (two) times daily.      Marland Kitchen loratadine (CLARITIN) 10 MG tablet Take 10 mg by mouth daily.      Marland Kitchen  losartan-hydrochlorothiazide (HYZAAR) 50-12.5 MG per tablet TAKE ONE TABLET BY MOUTH ONCE DAILY  30 tablet  5  . Potassium Gluconate 595 MG CAPS Take 1 capsule by mouth daily.      . progesterone (PROMETRIUM) 200 MG capsule TAKE ONE CAPSULE BY MOUTH ON DAYS 1-12 OF EVERY MONTH  36 capsule  3  . Red Yeast Rice 600 MG TABS Take 1 tablet by mouth 2 (two) times daily.       . Vitamin D, Ergocalciferol, (DRISDOL) 50000 UNITS CAPS capsule Take 1 capsule (50,000 Units total) by mouth every 7 (seven) days. Every 15 days  30 capsule  3  . vitamin E 400 UNIT capsule Take 400 Units by mouth daily.       No current facility-administered medications for this visit.    Family History  Problem Relation Age of Onset  . Stroke Mother   . Heart disease Mother   . Diabetes Father   . Kidney disease Father   . Hypertension Father   . Hyperlipidemia Father   . Coronary artery disease Father   . Hyperlipidemia Brother   . Hyperlipidemia Brother     ROS:  Pertinent items are noted in HPI.  Otherwise, a comprehensive ROS was negative.  Exam:   BP 120/66  Pulse 60  Resp 16  Ht 5' 2.25" (1.581 m)  Wt 126 lb (57.153 kg)  BMI 22.87 kg/m2  LMP 11/02/2011 Height: 5' 2.25" (158.1 cm)  Ht Readings from Last 3 Encounters:  09/24/13 5' 2.25" (1.581 m)  08/27/13 5' 2.5" (1.588 m)  09/11/12 5' 2.5" (1.588 m)    General appearance: alert, cooperative and appears stated age Head: Normocephalic, without obvious abnormality, atraumatic Neck: no adenopathy, supple, symmetrical, trachea midline and thyroid normal to inspection and palpation Lungs: clear to auscultation bilaterally Breasts: normal appearance, no masses or tenderness Heart: regular rate and rhythm Abdomen: soft, non-tender; no masses,  no organomegaly Extremities: extremities normal, atraumatic, no cyanosis or edema Skin: Skin color, texture, turgor normal. No rashes or lesions Lymph nodes: Cervical, supraclavicular, and axillary nodes  normal. No abnormal inguinal nodes palpated Neurologic: Grossly normal   Pelvic: External genitalia:  no lesions              Urethra:  normal appearing urethra with no masses, tenderness or lesions              Bartholin's and Skene's: normal                 Vagina: normal appearing vagina with normal color and discharge, no lesions              Cervix: absent              Pap taken: no Bimanual Exam:  Uterus:  uterus absent              Adnexa: no mass, fullness, tenderness               Rectovaginal: Confirms               Anus:  normal sphincter tone, no lesions  A:  Well Woman with normal exam  S/P R-TLH-LSO secondary to endometrioma, fibroids and adenomyosis on HRT    P:   Pap smear as per guidelines Not indicated  Refill on Vivelle dot for a year  Refill on Prometrium 200 mg day 1-12 monthly for a year  Refill on Vit D for a year - following lab results  mammogram  Counseled on breast self exam, osteoporosis, adequate intake of calcium and vitamin D, diet and exercise return annually or prn  An After Visit Summary was printed and given to the patient.

## 2013-09-25 LAB — VITAMIN D 25 HYDROXY (VIT D DEFICIENCY, FRACTURES): Vit D, 25-Hydroxy: 41 ng/mL (ref 30–89)

## 2013-09-26 NOTE — Progress Notes (Signed)
Encounter reviewed by Dr. Brook Silva.  

## 2013-09-27 ENCOUNTER — Telehealth: Payer: Self-pay | Admitting: *Deleted

## 2013-09-27 NOTE — Telephone Encounter (Signed)
Message copied by Luisa Dago on Mon Sep 27, 2013  4:57 PM ------      Message from: Roanna Banning      Created: Mon Sep 27, 2013 12:52 PM       Let patient know results, follow protocol ------

## 2013-09-27 NOTE — Telephone Encounter (Signed)
I have attempted to contact this patient by phone with the following results: left message to return my call on answering machine (home).  

## 2013-09-28 NOTE — Telephone Encounter (Signed)
Pt.notified

## 2013-09-28 NOTE — Telephone Encounter (Signed)
Patient is returning a call to stephanie said to call her back at work.

## 2013-10-06 ENCOUNTER — Encounter: Payer: Self-pay | Admitting: Family Medicine

## 2013-10-19 ENCOUNTER — Other Ambulatory Visit: Payer: Self-pay | Admitting: Nurse Practitioner

## 2013-10-19 DIAGNOSIS — Z1231 Encounter for screening mammogram for malignant neoplasm of breast: Secondary | ICD-10-CM

## 2013-11-22 ENCOUNTER — Ambulatory Visit (HOSPITAL_COMMUNITY)
Admission: RE | Admit: 2013-11-22 | Discharge: 2013-11-22 | Disposition: A | Discharge status: Home / Self Care | Payer: BC Managed Care – PPO | Source: Ambulatory Visit | Attending: Nurse Practitioner | Admitting: Nurse Practitioner

## 2013-11-22 DIAGNOSIS — Z1231 Encounter for screening mammogram for malignant neoplasm of breast: Secondary | ICD-10-CM | POA: Insufficient documentation

## 2013-12-01 ENCOUNTER — Encounter: Payer: Self-pay | Admitting: Family Medicine

## 2013-12-01 DIAGNOSIS — L819 Disorder of pigmentation, unspecified: Secondary | ICD-10-CM

## 2013-12-03 NOTE — Telephone Encounter (Signed)
Call pt to set up lab appt.

## 2014-05-11 ENCOUNTER — Encounter: Payer: Self-pay | Admitting: Family Medicine

## 2014-09-15 ENCOUNTER — Telehealth: Payer: Self-pay | Admitting: Nurse Practitioner

## 2014-09-15 NOTE — Telephone Encounter (Signed)
Called patient at work number (pt request) and no answer.  Will have to try again.

## 2014-09-15 NOTE — Telephone Encounter (Signed)
I would recommend stopping both the progestone and the estrogen.  This will allow [place] to know if the hormones are causing her symptoms.  We can deal with the hot flashes in another way if needed.  Please come in for a recheck in 4 weeks.

## 2014-09-15 NOTE — Telephone Encounter (Signed)
Spoke with patient. She Is is taking Vivelle Dot 0.1 mg Patch and Prometrium 200 mg. Patient is s/p R-TLH-LSO secondary to endometrioma, fibroids and adenomyosis. Patient has had ongoing symptoms of blurry vision, chest tightness and melasma, greater than one year. Has been evaluated for these concerns and patient feels now they are r/t progesterone. Patient states that chest tightness is "not a pain" but feels pressure around chest, will take her bra off while driving from work ([place] to [place]).  Patient felt ongoing chest tightness may be r/t her cat that likes to sleep on her chest. Patient reports blurry vision that was evaluated by Dr. Diona Browner on 08/27/13 and was suggested she follow up with opthalmology as symptoms decrease when she stands up at work and takes vision away from computer monitor. Patient takes breaks from work and symptoms resolve. Patient has been taking progesterone as prescribed and has not missed doses. Patient reports symptoms do not improve when she is not taking progesterone (takes progesterone on 16th-31st of month). Patient has hx of HBP which she monitors daily at home and takes Hyzaar if systolic BP is greater than 130. She states her BP ranges from 005-110 systolic.  Patient has been researching online and feels that these symptoms are r/t progesterone. She is wondering if she can stop.  Advised patient she should not stop taking progesterone until advised by provider. Advised patient can schedule office visit to discuss with Milford Cage, FNP and discuss her symptoms and concerns.  Patient aware she needs annual. Would like to schedule annual exam at this time and speak with Milford Cage, FNP about it at that time. Patient does not feel symptoms are emergent and require immediate evaluation.  Patient denies weakness, cp or sob. Denies any new medications.  Advised patient if symptoms worsen, to not wait to seek care. Monitor bp at work and check bp at time  of visual changes as well and update PCP with any BP changes. Patient is scheduled for annual exam with Milford Cage, FNP for 10/20/14 and will call back if any concerns prior. Advised would send a message to covering provider at this time and would call back with any further instructions. Patient is agreeable to all instructions and advice and will call back prn or seek immediate medical attention if any changes in symptoms. Routing to Dr. Quincy Simmonds and Milford Cage, FNP for review. Okay to close or further instructions?

## 2014-09-15 NOTE — Telephone Encounter (Signed)
Patient is taking progesterone and thinks she may be having some side effects. (blurry vision, chest tightening and hot flashes.)

## 2014-09-16 NOTE — Telephone Encounter (Signed)
Calling patient again at work number. No answer. Unable to leave voice mail. Will have to try again,

## 2014-09-16 NOTE — Telephone Encounter (Signed)
Patient given message from Dr. Quincy Simmonds.  She is agreeable to plan. She will stop taking both estrogen and progesterone and will call back with any concerns. Has annual exam with Milford Cage, FNP  in four weeks. Routing to provider for final review. Patient agreeable to disposition. Will close encounter

## 2014-10-03 ENCOUNTER — Encounter: Payer: Self-pay | Admitting: Nurse Practitioner

## 2014-10-20 ENCOUNTER — Encounter: Payer: Self-pay | Admitting: Nurse Practitioner

## 2014-10-20 ENCOUNTER — Ambulatory Visit (INDEPENDENT_AMBULATORY_CARE_PROVIDER_SITE_OTHER): Payer: BC Managed Care – PPO | Admitting: Nurse Practitioner

## 2014-10-20 VITALS — BP 126/82 | HR 80 | Ht 62.5 in | Wt 127.0 lb

## 2014-10-20 DIAGNOSIS — Z01419 Encounter for gynecological examination (general) (routine) without abnormal findings: Secondary | ICD-10-CM

## 2014-10-20 DIAGNOSIS — E559 Vitamin D deficiency, unspecified: Secondary | ICD-10-CM

## 2014-10-20 NOTE — Progress Notes (Signed)
Patient ID: [name], female   DOB: 06-Sep-1961, 52 y.o.   MRN: [id number] 53 y.o. G2P0010 Single Asian Fe here for annual exam.  Same partner and not SA.  He lives out of the country most of the time.  She is having increase in vaso symptoms.  While they are daily - for the most part they are tolerable since off HRT.   About a month ago she had symptoms of blurred vision, chest tightness, and melasma.  She felt that it was due to Progesterone but symptoms did not improve when she was off the Progesterone days.  Normally takes the 16 -30 monthly.  It was then advised by Dr. Quincy Simmonds to come off all HRT.  She has done so for this past month and feels the symptoms are still there.  Her most bothersome problems now is the heaviness of her arms while driving and the numbness she gets at night.  Trying to sleep in such a way that they do not cause pain.  The pain is not just at the wrist which would be consistent with carpal tunnel, but pain is forearms to upper arms as well.  Pressure feeling in chest is better she thinks.   Patient's last menstrual period was 11/02/2011 (exact date).    Pt is status post hysterectomy.       Sexually active: no  The current method of family planning is none.  Exercising: yes Home exercise routine includes walking 1/2 hour of fast walking 2-3 days per week. about 2.5 miles per walk. also does a lot of yard work.. Smoker: no  Health Maintenance: Pap: 02/28/11, WNL MMG: 11/22/13, BI-RADS 1: negative Colonoscopy: 06/22/02, repeat 10 years TDaP: 08/26/2007 Labs:   PCP, Dr. Diona Browner   reports that she has never smoked. She has never used smokeless tobacco. She reports that she does not drink alcohol or use illicit drugs.  Past Medical History  Diagnosis Date  . Hyperlipidemia   . Hypertension     pt dx withi last week. Dr. Joan Flores RX blood pressure med. pt unsure name   . Endometriosis     Past Surgical History  Procedure Laterality Date  . Cystoscopy   11/19/2011    Procedure: CYSTOSCOPY;  Surgeon: Lubertha South Romine;  Location: [facility];  Service: Gynecology;  Laterality: N/A;  . Robotic assisted lap vaginal hysterectomy Left 11/19/11    vag hyst/LSO    Current Outpatient Prescriptions  Medication Sig Dispense Refill  . aspirin 81 MG tablet Take 81 mg by mouth daily.    Marland Kitchen b complex vitamins tablet Take 1 tablet by mouth daily.     . Calcium Carbonate (CALCIUM 600 PO) Take 600 mg by mouth daily.    . Flaxseed, Linseed, 1300 MG CAPS Take 2 capsules by mouth 2 (two) times daily.    Marland Kitchen losartan-hydrochlorothiazide (HYZAAR) 50-12.5 MG per tablet TAKE ONE TABLET BY MOUTH ONCE DAILY 30 tablet 5  . Potassium Gluconate 595 MG CAPS Take 1 capsule by mouth daily.    . Red Yeast Rice 600 MG TABS Take 1 tablet by mouth 2 (two) times daily.     . vitamin C (ASCORBIC ACID) 500 MG tablet Take 500 mg by mouth daily.    . Vitamin D, Ergocalciferol, (DRISDOL) 50000 UNITS CAPS capsule Take 1 capsule (50,000 Units total) by mouth every 7 (seven) days. (Patient taking differently: Take 50,000 Units by mouth every 30 (thirty) days. ) 30 capsule 3   No current facility-administered medications for  this visit.    Family History  Problem Relation Age of Onset  . Stroke Mother   . Heart disease Mother   . Diabetes Father   . Kidney disease Father   . Hypertension Father   . Hyperlipidemia Father   . Coronary artery disease Father   . Hyperlipidemia Brother   . Hyperlipidemia Brother     ROS:  Pertinent items are noted in HPI.  Otherwise, a comprehensive ROS was negative.  Exam:   BP 126/82 mmHg  Pulse 80  Ht 5' 2.5" (1.588 m)  Wt 127 lb (57.607 kg)  BMI 22.84 kg/m2  LMP 11/02/2011 (Exact Date) Height: 5' 2.5" (158.8 cm)  Ht Readings from Last 3 Encounters:  10/20/14 5' 2.5" (1.588 m)  09/24/13 5' 2.25" (1.581 m)  08/27/13 5' 2.5" (1.588 m)    General appearance: alert, cooperative and appears stated age Head: Normocephalic, without obvious  abnormality, atraumatic Neck: no adenopathy, supple, symmetrical, trachea midline and thyroid normal to inspection and palpation Lungs: clear to auscultation bilaterally Breasts: normal appearance, no masses or tenderness Heart: regular rate and rhythm Abdomen: soft, non-tender; no masses,  no organomegaly Extremities: extremities normal, atraumatic, no cyanosis or edema Skin: Skin color, texture, turgor normal. No rashes or lesions Lymph nodes: Cervical, supraclavicular, and axillary nodes normal. No abnormal inguinal nodes palpated Neurologic: Grossly normal   Pelvic: External genitalia:  no lesions              Urethra:  normal appearing urethra with no masses, tenderness or lesions              Bartholin's and Skene's: normal                 Vagina: normal appearing vagina with normal color and discharge, no lesions              Cervix: absent              Pap taken: No. Bimanual Exam:  Uterus:  uterus absent              Adnexa: no mass, fullness, tenderness               Rectovaginal: Confirms               Anus:  normal sphincter tone, no lesions  A:  Well Woman with normal exam  S/P R-TLH-LSO secondary to endometrioma, fibroids and adenomyosis on HRT  Currently off HRT secondary to chest pain and numbness of arms  Needs to R/O carpal tunnel or other reasons for chest pain and numbness   P:   Reviewed health and wellness pertinent to exam  Pap smear not taken today  Mammogram is due 12/15  She will be followed by PCP in 2 weeks.  Counseled on breast self exam, mammography screening, adequate intake of calcium and vitamin D, diet and exercise, Kegel's exercises return annually or prn  An After Visit Summary was printed and given to the patient.

## 2014-10-20 NOTE — Patient Instructions (Signed)

## 2014-10-21 LAB — VITAMIN D 25 HYDROXY (VIT D DEFICIENCY, FRACTURES): Vit D, 25-Hydroxy: 44 ng/mL (ref 30–100)

## 2014-10-23 NOTE — Progress Notes (Signed)
Encounter reviewed by Dr. Brook Silva.  

## 2014-11-29 ENCOUNTER — Other Ambulatory Visit: Payer: Self-pay | Admitting: Family Medicine

## 2014-11-30 ENCOUNTER — Other Ambulatory Visit: Payer: Self-pay | Admitting: Nurse Practitioner

## 2014-11-30 DIAGNOSIS — Z1231 Encounter for screening mammogram for malignant neoplasm of breast: Secondary | ICD-10-CM

## 2014-12-06 ENCOUNTER — Ambulatory Visit: Payer: BC Managed Care – PPO | Admitting: Family Medicine

## 2014-12-13 ENCOUNTER — Ambulatory Visit (HOSPITAL_COMMUNITY)
Admission: RE | Admit: 2014-12-13 | Discharge: 2014-12-13 | Disposition: A | Discharge status: Home / Self Care | Payer: BLUE CROSS/BLUE SHIELD | Source: Ambulatory Visit | Attending: Nurse Practitioner | Admitting: Nurse Practitioner

## 2014-12-13 DIAGNOSIS — Z1231 Encounter for screening mammogram for malignant neoplasm of breast: Secondary | ICD-10-CM | POA: Insufficient documentation

## 2015-01-27 ENCOUNTER — Other Ambulatory Visit (INDEPENDENT_AMBULATORY_CARE_PROVIDER_SITE_OTHER): Payer: BLUE CROSS/BLUE SHIELD

## 2015-01-27 ENCOUNTER — Telehealth: Payer: Self-pay | Admitting: Family Medicine

## 2015-01-27 DIAGNOSIS — E785 Hyperlipidemia, unspecified: Secondary | ICD-10-CM | POA: Diagnosis not present

## 2015-01-27 LAB — COMPREHENSIVE METABOLIC PANEL
ALBUMIN: 4.6 g/dL (ref 3.5–5.2)
ALK PHOS: 58 U/L (ref 39–117)
ALT: 14 U/L (ref 0–35)
AST: 16 U/L (ref 0–37)
BUN: 10 mg/dL (ref 6–23)
CO2: 32 mEq/L (ref 19–32)
Calcium: 9.8 mg/dL (ref 8.4–10.5)
Chloride: 104 mEq/L (ref 96–112)
Creatinine, Ser: 0.78 mg/dL (ref 0.40–1.20)
GFR: 82.16 mL/min (ref >60.00)
Glucose, Bld: 92 mg/dL (ref 70–99)
Potassium: 4.1 mEq/L (ref 3.5–5.1)
Sodium: 142 mEq/L (ref 135–145)
Total Bilirubin: 0.7 mg/dL (ref 0.2–1.2)
Total Protein: 7.3 g/dL (ref 6.0–8.3)

## 2015-01-27 LAB — LIPID PANEL
Cholesterol: 215 mg/dL — ABNORMAL HIGH (ref 0–200)
HDL: 59.7 mg/dL (ref >39.00)
LDL CALC: 121 mg/dL — AB (ref 0–99)
NONHDL: 155.3
Total CHOL/HDL Ratio: 4
Triglycerides: 171 mg/dL — ABNORMAL HIGH (ref 0.0–149.0)
VLDL: 34.2 mg/dL (ref 0.0–40.0)

## 2015-01-27 NOTE — Telephone Encounter (Signed)
-----   Message from Ellamae Sia sent at 01/20/2015 10:36 AM EST ----- Regarding: Lab orders for Friday, 2.26.16 Patient is scheduled for CPX labs, please order future labs, Thanks , Karna Christmas

## 2015-02-03 ENCOUNTER — Encounter: Payer: Self-pay | Admitting: Family Medicine

## 2015-02-03 ENCOUNTER — Ambulatory Visit (INDEPENDENT_AMBULATORY_CARE_PROVIDER_SITE_OTHER): Payer: BLUE CROSS/BLUE SHIELD | Admitting: Family Medicine

## 2015-02-03 VITALS — BP 140/90 | HR 76 | Temp 98.8°F | Ht 62.5 in | Wt 126.5 lb

## 2015-02-03 DIAGNOSIS — Z23 Encounter for immunization: Secondary | ICD-10-CM

## 2015-02-03 DIAGNOSIS — L819 Disorder of pigmentation, unspecified: Secondary | ICD-10-CM

## 2015-02-03 DIAGNOSIS — I10 Essential (primary) hypertension: Secondary | ICD-10-CM

## 2015-02-03 DIAGNOSIS — Z0000 Encounter for general adult medical examination without abnormal findings: Secondary | ICD-10-CM | POA: Diagnosis not present

## 2015-02-03 DIAGNOSIS — Z1211 Encounter for screening for malignant neoplasm of colon: Secondary | ICD-10-CM

## 2015-02-03 DIAGNOSIS — R61 Generalized hyperhidrosis: Secondary | ICD-10-CM | POA: Insufficient documentation

## 2015-02-03 DIAGNOSIS — E785 Hyperlipidemia, unspecified: Secondary | ICD-10-CM

## 2015-02-03 MED ORDER — BETAMETHASONE DIPROPIONATE 0.05 % EX CREA: TOPICAL_CREAM | Freq: Two times a day (BID) | CUTANEOUS | Status: DC

## 2015-02-03 MED ORDER — LOSARTAN POTASSIUM-HCTZ 50-12.5 MG PO TABS: 1.0000 | ORAL_TABLET | Freq: Every day | ORAL | Status: DC

## 2015-02-03 NOTE — Patient Instructions (Addendum)
Stop at lab on way put to pick stool test.  Continue working on healthy eating and regular exercise  Can use steroid cream on arms for hyperpigmented patches.

## 2015-02-03 NOTE — Assessment & Plan Note (Signed)
Trail of betameth cream on areas.

## 2015-02-03 NOTE — Assessment & Plan Note (Signed)
Well controlled on red yeast rice

## 2015-02-03 NOTE — Progress Notes (Signed)
The patient is here for annual wellness exam and preventative care.  She sees GYN for CPX/pelvic.  She has noted for years hyperpigmented rash on arms, face and back.. Ever since in college.  IMproves with triluma, but thius is too expensive to use on arms and back areas given larger.  She requests med to treat. She has seen may dermatologist in past.   Elevated Cholesterol: On red yeast rice and flax seed... LDL at goal < 130, trig elevated Lab Results  Component Value Date   CHOL 215* 01/27/2015   HDL 59.70 01/27/2015   LDLCALC 121* 01/27/2015   LDLDIRECT 126.8 08/25/2013   TRIG 171.0* 01/27/2015   CHOLHDL 4 01/27/2015   CAD Risks: Low HDL, no HTN, nonsmoker, >age 27, strong family history CAD Using medications without problems: Muscle aches: None Diet compliance:Moderate Exercise:walks. Other complaints:  Hypertension: Well controlled at home on losartan HCTZ. She feelsBP up today because had a lot of salty food. BP Readings from Last 3 Encounters:   BP Readings from Last 3 Encounters:  02/03/15 140/90  10/20/14 126/82  09/24/13 120/66  Using medication without problems or lightheadedness: None Chest pain with exertion:None Edema:None Short of breath:None Average home BPs:110/70 Other issues:   Review of Systems  Constitutional: Negative for fever, fatigue and unexpected weight change.  HENT: Negative for ear pain, congestion, sore throat, sneezing, trouble swallowing and sinus pressure.  Eyes: Negative for pain and itching.  Respiratory: Negative for cough, shortness of breath and wheezing.  Cardiovascular: Negative for chest pain, palpitations and leg swelling.  Gastrointestinal: Positive for constipation. Negative for nausea, abdominal pain, diarrhea and blood in stool.  Genitourinary: Negative for dysuria, hematuria, vaginal discharge, difficulty urinating and menstrual problem.  Skin: Negative for rash.  Neurological: Negative for syncope, weakness,  light-headedness, numbness and headaches.  Psychiatric/Behavioral: Negative for confusion and dysphoric mood. The patient is not nervous/anxious.       Objective:   Physical Exam  Constitutional: Vital signs are normal. She appears well-developed and well-nourished. She is cooperative. Non-toxic appearance. She does not appear ill. No distress.  HENT:  Head: Normocephalic.  Right Ear: Hearing, tympanic membrane, external ear and ear canal normal.  Left Ear: Hearing, tympanic membrane, external ear and ear canal normal.  Nose: Nose normal.  Eyes: Conjunctivae, EOM and lids are normal. Pupils are equal, round, and reactive to light. Lids are everted and swept, no foreign bodies found.  Fundoscopic exam:  The right eye shows no hemorrhage and no papilledema.   The left eye shows no hemorrhage and no papilledema.  Neck: Trachea normal and normal range of motion. Neck supple. Carotid bruit is not present. No mass and no thyromegaly present.  Cardiovascular: Normal rate, regular rhythm, S1 normal, S2 normal, normal heart sounds and intact distal pulses. Exam reveals no gallop.  No murmur heard. Pulmonary/Chest: Effort normal and breath sounds normal. No respiratory distress. She has no wheezes. She has no rhonchi. She has no rales.  Abdominal: Soft. Normal appearance and bowel sounds are normal. She exhibits no distension, no fluid wave, no abdominal bruit and no mass. There is no hepatosplenomegaly. There is no tenderness. There is no rebound, no guarding and no CVA tenderness. No hernia.  Lymphadenopathy:   She has no cervical adenopathy.   She has no axillary adenopathy.  Neurological: She is alert. She has normal strength and normal reflexes. No cranial nerve deficit or sensory deficit. She displays a negative Romberg sign.  Skin: Skin is warm, dry and  intact. Hyperpigmented skin on arms back and cheeks of face. Psychiatric: Her speech is normal and behavior is normal.  Judgment normal. Her mood appears not anxious. Cognition and memory are normal. She does not exhibit a depressed mood.          Assessment & Plan:  The patient's preventative maintenance and recommended screening tests for an annual wellness exam were reviewed in full today. Brought up to date unless services declined.  Counselled on the importance of diet, exercise, and its role in overall health and mortality. The patient's FH and SH was reviewed, including their home life, tobacco status, and drug and alcohol status.   Vaccines: given flu, uptodate with TD. Colon: 2003 Dr. Olevia Perches, nml, recommend repeat in 10 years.. Given she cannot do this now we will do IFOB yearly. Mammo:12/2014, breast exam per GYN Pap/DVE: [facility] 2012, followed per GYN        HIV screen, hep C refused.

## 2015-02-03 NOTE — Assessment & Plan Note (Signed)
Well controlled. Continue current medication.  

## 2015-02-03 NOTE — Progress Notes (Signed)
Pre visit review using our clinic review tool, if applicable. No additional management support is needed unless otherwise documented below in the visit note. 

## 2015-02-06 ENCOUNTER — Telehealth: Payer: Self-pay | Admitting: Family Medicine

## 2015-02-06 NOTE — Telephone Encounter (Signed)
emmi emailed °

## 2015-02-09 ENCOUNTER — Encounter: Payer: Self-pay | Admitting: Nurse Practitioner

## 2015-03-02 ENCOUNTER — Other Ambulatory Visit: Payer: Self-pay | Admitting: Obstetrics & Gynecology

## 2015-03-02 DIAGNOSIS — R234 Changes in skin texture: Secondary | ICD-10-CM

## 2015-03-06 ENCOUNTER — Other Ambulatory Visit (INDEPENDENT_AMBULATORY_CARE_PROVIDER_SITE_OTHER): Payer: BLUE CROSS/BLUE SHIELD

## 2015-03-06 DIAGNOSIS — R234 Changes in skin texture: Secondary | ICD-10-CM

## 2015-03-06 LAB — THYROID PANEL WITH TSH
FREE THYROXINE INDEX: 2.2 (ref 1.4–3.8)
T3 UPTAKE: 30 % (ref 22–35)
T4, Total: 7.4 ug/dL (ref 4.5–12.0)
TSH: 1.072 u[IU]/mL (ref 0.350–4.500)

## 2015-03-08 ENCOUNTER — Telehealth: Payer: Self-pay

## 2015-03-08 NOTE — Telephone Encounter (Signed)
-----   Message from Megan Salon, MD sent at 03/08/2015 10:19 AM EDT ----- Please inform pt thyroid testing was negative.  She sent a mychart message and the thyroid testing was ordered.  This is a pt of [facility].  If she has any additional questions, needs OV.

## 2015-03-08 NOTE — Telephone Encounter (Signed)
Spoke with patient. Advised of results as seen below from [place]. Patient is agreeable. Patient states "I have all these brown spots all over my body that are coming up more often." Advised with normal test results I recommend she be seen with PCP for further evaluation. "I saw them already and have been to the dermatologist. They gave me cream but it is really expensive so I can not use it all over my body." Advised patient as she is under current care with dermatology should call to schedule follow up appointment if symptoms are increasing. Patient is agreeable and will call dermatology to be seen for evaluation.  Routing to provider for final review. Patient agreeable to disposition. Will close encounter

## 2015-04-10 ENCOUNTER — Encounter: Payer: Self-pay | Admitting: Family Medicine

## 2015-04-18 ENCOUNTER — Ambulatory Visit (INDEPENDENT_AMBULATORY_CARE_PROVIDER_SITE_OTHER): Payer: BLUE CROSS/BLUE SHIELD | Admitting: Family Medicine

## 2015-04-18 ENCOUNTER — Encounter: Payer: Self-pay | Admitting: Family Medicine

## 2015-04-18 VITALS — BP 130/90 | HR 69 | Temp 98.7°F | Ht 62.5 in | Wt 128.2 lb

## 2015-04-18 DIAGNOSIS — M79643 Pain in unspecified hand: Secondary | ICD-10-CM | POA: Diagnosis not present

## 2015-04-18 DIAGNOSIS — R200 Anesthesia of skin: Secondary | ICD-10-CM

## 2015-04-18 DIAGNOSIS — M25549 Pain in joints of unspecified hand: Secondary | ICD-10-CM | POA: Insufficient documentation

## 2015-04-18 DIAGNOSIS — R202 Paresthesia of skin: Secondary | ICD-10-CM | POA: Diagnosis not present

## 2015-04-18 MED ORDER — DICLOFENAC SODIUM 75 MG PO TBEC: 75.0000 mg | DELAYED_RELEASE_TABLET | Freq: Two times a day (BID) | ORAL | Status: DC

## 2015-04-18 NOTE — Progress Notes (Signed)
Pre visit review using our clinic review tool, if applicable. No additional management support is needed unless otherwise documented below in the visit note. 

## 2015-04-18 NOTE — Progress Notes (Signed)
Subjective:     Patient ID: [name], female   DOB: 1961-07-05, 54 y.o.   MRN: [id number]  HPI [name] is a 54 y/o F presenting with finger stiffness of 4 weeks duration. Affecting the 4th and 5th digits on left hand, 5th finger on right hand, 5th digit on left foot. Has stayed about the same over the past 4 weeks. Worst in the mornings, improves with repeated movements as day goes on. No injuries to hands. Not painful, denies change in sensation. Tried using Aleve, does not help. No family hx of arthritis. No fevers, no URI symptoms preceding joint stiffness, denies any redness or swelling in joints. No tick bites, no headache, no rash. Does not perform repeated activities like typing. Recent thyroid panel normal, per pt.  States that her arms are numb/tingly upon waking. Numbness goes away as she moves her arms. Worse if she leans on her arm. Denies neck pain, neck stiffness.   Review of Systems  Constitutional: Negative for fever.  HENT: Negative for congestion and sore throat.   Genitourinary: Positive for vaginal bleeding.  Musculoskeletal: Negative for joint swelling, neck pain and neck stiffness.  Skin: Negative for rash.  Neurological: Positive for numbness. Negative for weakness and headaches.       Objective:   Physical Exam  Constitutional: She is oriented to person, place, and time. She appears well-developed and well-nourished.  HENT:  Head: Normocephalic and atraumatic.  Neck: Normal range of motion.  Musculoskeletal:  No swelling, no erythema, no tenderness to palpation in hands/fingers bilaterally No tenderness to palpation over cervical spine Negative Sperling's  Neurological: She is alert and oriented to person, place, and time.  Strength and sensation intact in hands/fingers bilaterally No numbness with compression of ulnar nerve Negative Phalen's  Skin: Skin is warm and dry.  Psychiatric: She has a normal mood and affect. Her behavior is normal.        Assessment:     1. Osteoarthritis: Joint stiffness likely due to osteoarthritis. Reactive arthritis less likely due to lack of associated sx. No family history of rheumatoid arthritis and sx not very consistent, so less likely.   2. Numbness: Likely due to sleeping position compressing ulnar nerve.    Plan:     1. OA: Advised to use an anti-inflammatory, continue stretching and moving joints to relieve stiffness. If not improving in 2-4 weeks consider further eval with labs.  2. Numbness: Advised to adjust sleeping position, prop arm up with a pillow while sleeping.     Dante Gang served as Education administrator for Dr. Diona Browner 04/18/15 8:00 am  Patient seen and examined. Med student acted as Education administrator only.  HPI/ROS/PE and assessment/plan created  By MD and scribed by med student.  Eliezer Lofts MD

## 2015-04-18 NOTE — Patient Instructions (Addendum)
Continue stretching and moving joints to relieve stiffness. Start using the anti-inflammatory.  Call if not improving as expected in about 4 weeks

## 2015-04-25 ENCOUNTER — Telehealth: Payer: Self-pay

## 2015-04-25 NOTE — Telephone Encounter (Signed)
Pt left v/m; pt started diclofenac on 04/24/15 and hands are doing better but BP is 799 for systolic; systolic BP is usually 872-158. pt wants to know if should continue diclofenac or what to do. Pt request cb.

## 2015-04-25 NOTE — Telephone Encounter (Signed)
Continue to follow BP  and continue med for 2-3 more days, if BP remaining elevated can stop at that point. If dizziness, headache etc can stop sooner.

## 2015-04-26 NOTE — Telephone Encounter (Signed)
Shekia notified as instructed by telephone.

## 2015-07-04 ENCOUNTER — Encounter: Payer: Self-pay | Admitting: Family Medicine

## 2015-07-13 ENCOUNTER — Ambulatory Visit (INDEPENDENT_AMBULATORY_CARE_PROVIDER_SITE_OTHER): Payer: BLUE CROSS/BLUE SHIELD | Admitting: Family Medicine

## 2015-07-13 ENCOUNTER — Encounter: Payer: Self-pay | Admitting: Family Medicine

## 2015-07-13 VITALS — BP 120/60 | HR 63 | Temp 98.4°F | Wt 127.0 lb

## 2015-07-13 DIAGNOSIS — R5383 Other fatigue: Secondary | ICD-10-CM | POA: Diagnosis not present

## 2015-07-13 DIAGNOSIS — M2550 Pain in unspecified joint: Secondary | ICD-10-CM

## 2015-07-13 LAB — VITAMIN D 25 HYDROXY (VIT D DEFICIENCY, FRACTURES): VITD: 40.75 ng/mL (ref 30.00–100.00)

## 2015-07-13 LAB — C-REACTIVE PROTEIN: CRP: 0.1 mg/dL — ABNORMAL LOW (ref 0.5–20.0)

## 2015-07-13 LAB — RHEUMATOID FACTOR: Rhuematoid fact SerPl-aCnc: <10 IU/mL (ref <=14)

## 2015-07-13 LAB — SEDIMENTATION RATE: Sed Rate: 17 mm/hr (ref 0–22)

## 2015-07-13 LAB — TSH: TSH: 0.93 u[IU]/mL (ref 0.35–4.50)

## 2015-07-13 NOTE — Progress Notes (Signed)
   Subjective:    Patient ID: [name], female    DOB: 09/02/1961, 54 y.o.   MRN: [id number]  HPI  54 year old female presents for joint pain.  At last OV in 04/2015 felt likely OA causing symptoms. Treated with diclofenac.  She reports  she has now had spread of joint pain to all fingers, now in feet as well. She has noted swelling and redness in fingers.  Cramping and stiffness in hands with typing.  Stiffness in hands, does not go away completely, but improves some after a few minutes.  Off and on in left upper chest and left upper back, non exrtional. Improves with aleve. Worse with moving shoulder.  Diclofenac helps a little but not much.  She has started taking vit D daily    She is also tired.. Asking if okay to take melatonin to sleep and B12 for energy.  Family history:  No rheumatoid arthritis, lupus.   Review of Systems  Constitutional: Negative for fever and fatigue.  HENT: Negative for ear pain.   Eyes: Negative for pain.  Respiratory: Negative for chest tightness and shortness of breath.   Cardiovascular: Negative for chest pain, palpitations and leg swelling.  Gastrointestinal: Negative for abdominal pain.  Genitourinary: Negative for dysuria.       Objective:   Physical Exam  Constitutional: Vital signs are normal. She appears well-developed and well-nourished. She is cooperative.  Non-toxic appearance. She does not appear ill. No distress.  HENT:  Head: Normocephalic.  Right Ear: Hearing, tympanic membrane, external ear and ear canal normal. Tympanic membrane is not erythematous, not retracted and not bulging.  Left Ear: Hearing, tympanic membrane, external ear and ear canal normal. Tympanic membrane is not erythematous, not retracted and not bulging.  Nose: No mucosal edema or rhinorrhea. Right sinus exhibits no maxillary sinus tenderness and no frontal sinus tenderness. Left sinus exhibits no maxillary sinus tenderness and no frontal sinus tenderness.    Mouth/Throat: Uvula is midline, oropharynx is clear and moist and mucous membranes are normal.  Eyes: Conjunctivae, EOM and lids are normal. Pupils are equal, round, and reactive to light. Lids are everted and swept, no foreign bodies found.  Neck: Trachea normal and normal range of motion. Neck supple. Carotid bruit is not present. No thyroid mass and no thyromegaly present.  Cardiovascular: Normal rate, regular rhythm, S1 normal, S2 normal, normal heart sounds, intact distal pulses and normal pulses.  Exam reveals no gallop and no friction rub.   No murmur heard. Pulmonary/Chest: Effort normal and breath sounds normal. No tachypnea. No respiratory distress. She has no decreased breath sounds. She has no wheezes. She has no rhonchi. She has no rales.  Abdominal: Soft. Normal appearance and bowel sounds are normal. There is no tenderness.  Musculoskeletal:  Stiff and sore in PIP and DIP joints bilateral hands.  Also in 5th digits bilateral feet.  No wrist pain, no ankle , elbow pain.  Full ROM of left shoulder, neg Neer's , neg drop arm.  Neurological: She is alert.  Skin: Skin is warm, dry and intact. No rash noted.  Psychiatric: Her speech is normal and behavior is normal. Judgment and thought content normal. Her mood appears not anxious. Cognition and memory are normal. She does not exhibit a depressed mood.          Assessment & Plan:

## 2015-07-13 NOTE — Patient Instructions (Signed)
Stop at lab on way out.  Continue aleve as needed.

## 2015-07-13 NOTE — Assessment & Plan Note (Signed)
Given worsening of symptoms, symmetric involvement and small joint involvement...autoimmune disease is a concern.  Start with labs eval for generalized inflammation, lupus, and RA. Will also check vit D and TSH levels  Consider hand X-ray to eval for erosions.   In meantime,use ibuprofen to treat.

## 2015-07-13 NOTE — Assessment & Plan Note (Signed)
Okay to use B complex for energy.  Check TSH and vit D. Can use melatonin for better rest and to decrease frequent waking.

## 2015-07-13 NOTE — Progress Notes (Signed)
Pre visit review using our clinic review tool, if applicable. No additional management support is needed unless otherwise documented below in the visit note. 

## 2015-07-14 ENCOUNTER — Other Ambulatory Visit: Payer: Self-pay | Admitting: Family Medicine

## 2015-07-14 LAB — ANA: ANA: NEGATIVE

## 2015-07-14 LAB — CYCLIC CITRUL PEPTIDE ANTIBODY, IGG: Cyclic Citrullin Peptide Ab: <2 U/mL (ref 0.0–5.0)

## 2015-07-28 ENCOUNTER — Other Ambulatory Visit: Payer: Self-pay | Admitting: Family Medicine

## 2015-07-28 ENCOUNTER — Encounter: Payer: Self-pay | Admitting: Family Medicine

## 2015-07-28 MED ORDER — MELOXICAM 15 MG PO TABS: 15.0000 mg | ORAL_TABLET | Freq: Every day | ORAL | Status: DC

## 2015-07-28 NOTE — Telephone Encounter (Signed)
Last office visit  07/13/2015.  Meloxicam not on current medication list.  Ok to refill?

## 2015-08-15 ENCOUNTER — Encounter: Payer: Self-pay | Admitting: Family Medicine

## 2015-08-22 ENCOUNTER — Ambulatory Visit: Payer: BLUE CROSS/BLUE SHIELD | Admitting: Family Medicine

## 2015-08-25 ENCOUNTER — Ambulatory Visit
Admission: RE | Admit: 2015-08-25 | Discharge: 2015-08-25 | Disposition: A | Discharge status: Home / Self Care | Payer: BLUE CROSS/BLUE SHIELD | Source: Ambulatory Visit | Attending: Family Medicine | Admitting: Family Medicine

## 2015-08-25 ENCOUNTER — Ambulatory Visit (INDEPENDENT_AMBULATORY_CARE_PROVIDER_SITE_OTHER)
Admission: RE | Admit: 2015-08-25 | Discharge: 2015-08-25 | Disposition: A | Discharge status: Home / Self Care | Payer: BLUE CROSS/BLUE SHIELD | Source: Ambulatory Visit | Attending: Family Medicine | Admitting: Family Medicine

## 2015-08-25 ENCOUNTER — Ambulatory Visit (INDEPENDENT_AMBULATORY_CARE_PROVIDER_SITE_OTHER): Payer: BLUE CROSS/BLUE SHIELD | Admitting: Family Medicine

## 2015-08-25 ENCOUNTER — Encounter: Payer: Self-pay | Admitting: Family Medicine

## 2015-08-25 VITALS — BP 130/90 | HR 66 | Temp 98.6°F | Ht 62.5 in | Wt 125.2 lb

## 2015-08-25 DIAGNOSIS — M25649 Stiffness of unspecified hand, not elsewhere classified: Secondary | ICD-10-CM | POA: Diagnosis not present

## 2015-08-25 DIAGNOSIS — M25642 Stiffness of left hand, not elsewhere classified: Secondary | ICD-10-CM

## 2015-08-25 DIAGNOSIS — M25641 Stiffness of right hand, not elsewhere classified: Secondary | ICD-10-CM | POA: Diagnosis not present

## 2015-08-25 NOTE — Progress Notes (Signed)
Pre visit review using our clinic review tool, if applicable. No additional management support is needed unless otherwise documented below in the visit note. 

## 2015-08-25 NOTE — Progress Notes (Signed)
Subjective:    Patient ID: [name], female    DOB: 01-14-1961, 54 y.o.   MRN: [id number]  HPI   54 year old female presents with continued  bilateral hand stiffness x  > 6 months.   Last OV 8/11 for similar issue: At last OV in 04/2015 felt likely OA causing symptoms. Treated with diclofenac. She reports she has now had spread of joint pain to all fingers, now in feet as well. She has noted swelling and redness in fingers. Cramping and stiffness in hands with typing. Stiffness in hands, does not go away completely, but improves some after a few minutes. Off and on in left upper chest and left upper back, non exrtional. Improves with aleve. Worse with moving shoulder. Diclofenac helps a little but not much. She has started taking vit D daily   Evaluated with  Neg tests for RA, sed rate, ana,TSH and Vit D levels... nml  Using ibuprofen to treat.  Today she reports no improvement in finger stiffness (mainly DIP and PIP joint), mild pain. Not using medication. No worsening. No redness and welling  After sitting or waking up in AM a while hands stiffen up.  Improves after a few minutes with massaging hands. Occ numbness in arms.. Improved if does not lean on arms repetitively.  Uses computer a lot.   Family History  Problem Relation Age of Onset  . Stroke Mother   . Heart disease Mother   . Diabetes Father   . Kidney disease Father   . Hypertension Father   . Hyperlipidemia Father   . Coronary artery disease Father   . Hyperlipidemia Brother   . Hyperlipidemia Brother       Review of Systems  Constitutional: Negative for fever and fatigue.  HENT: Negative for ear pain.   Eyes: Negative for pain.  Respiratory: Negative for chest tightness and shortness of breath.   Cardiovascular: Negative for chest pain, palpitations and leg swelling.  Gastrointestinal: Negative for abdominal pain.  Genitourinary: Negative for dysuria.       Objective:   Physical  Exam  Constitutional: Vital signs are normal. She appears well-developed and well-nourished. She is cooperative.  Non-toxic appearance. She does not appear ill. No distress.  HENT:  Head: Normocephalic.  Right Ear: Hearing, tympanic membrane, external ear and ear canal normal. Tympanic membrane is not erythematous, not retracted and not bulging.  Left Ear: Hearing, tympanic membrane, external ear and ear canal normal. Tympanic membrane is not erythematous, not retracted and not bulging.  Nose: No mucosal edema or rhinorrhea. Right sinus exhibits no maxillary sinus tenderness and no frontal sinus tenderness. Left sinus exhibits no maxillary sinus tenderness and no frontal sinus tenderness.  Mouth/Throat: Uvula is midline, oropharynx is clear and moist and mucous membranes are normal.  Eyes: Conjunctivae, EOM and lids are normal. Pupils are equal, round, and reactive to light. Lids are everted and swept, no foreign bodies found.  Neck: Trachea normal and normal range of motion. Neck supple. Carotid bruit is not present. No thyroid mass and no thyromegaly present.  Cardiovascular: Normal rate, regular rhythm, S1 normal, S2 normal, normal heart sounds, intact distal pulses and normal pulses.  Exam reveals no gallop and no friction rub.   No murmur heard. Pulmonary/Chest: Effort normal and breath sounds normal. No tachypnea. No respiratory distress. She has no decreased breath sounds. She has no wheezes. She has no rhonchi. She has no rales.  Abdominal: Soft. Normal appearance and bowel sounds are normal.  There is no tenderness.  Musculoskeletal:  Stiff and sore in PIP and DIP joints bilateral hands.  Also in 5th digits bilateral feet.  No wrist pain, no ankle , elbow pain.    Neurological: She is alert.  Skin: Skin is warm, dry and intact. No rash noted.  Psychiatric: Her speech is normal and behavior is normal. Judgment and thought content normal. Her mood appears not anxious. Cognition and memory  are normal. She does not exhibit a depressed mood.          Assessment & Plan:

## 2015-08-25 NOTE — Assessment & Plan Note (Signed)
Given location, no redness and swelling as well as neg labs.. Likely OA as opposed to autoimmune disease.  Will eval for erosions on bilateral hand films.  Start glucosamine.  If not improving will refer to rheum or hand specialist.

## 2015-08-25 NOTE — Patient Instructions (Addendum)
Start glucosamine 500 mg three times a day.  we will call with X-ray resutls.

## 2015-09-28 ENCOUNTER — Encounter: Payer: Self-pay | Admitting: Family Medicine

## 2015-09-29 NOTE — Telephone Encounter (Signed)
Can you forward this to the appropriate person?

## 2015-10-04 ENCOUNTER — Telehealth: Payer: Self-pay

## 2015-10-04 NOTE — Telephone Encounter (Signed)
[employer] wanted to ck on status of med records request; spoke with [name] and she was advised sent to [employer] [phone] on 09/26/15 and can take 14-16 business days for response. [name] voiced understanding.

## 2016-02-21 ENCOUNTER — Encounter: Payer: Self-pay | Admitting: Family Medicine

## 2016-02-22 MED ORDER — FLUOCIN-HYDROQUINONE-TRETINOIN 0.01-4-0.05 % EX CREA: TOPICAL_CREAM | CUTANEOUS | Status: DC

## 2016-03-18 ENCOUNTER — Encounter: Payer: Self-pay | Admitting: Family Medicine

## 2016-03-18 MED ORDER — FLUOCIN-HYDROQUINONE-TRETINOIN 0.01-4-0.05 % EX CREA: TOPICAL_CREAM | CUTANEOUS | Status: DC

## 2016-03-27 ENCOUNTER — Encounter: Payer: Self-pay | Admitting: Family Medicine

## 2016-03-27 MED ORDER — FLUOCIN-HYDROQUINONE-TRETINOIN 0.01-4-0.05 % EX CREA: TOPICAL_CREAM | CUTANEOUS | Status: DC

## 2016-04-23 ENCOUNTER — Encounter: Payer: Self-pay | Admitting: Family Medicine

## 2016-05-01 ENCOUNTER — Telehealth: Payer: Self-pay | Admitting: Family Medicine

## 2016-05-01 ENCOUNTER — Other Ambulatory Visit (INDEPENDENT_AMBULATORY_CARE_PROVIDER_SITE_OTHER): Payer: PRIVATE HEALTH INSURANCE

## 2016-05-01 DIAGNOSIS — E785 Hyperlipidemia, unspecified: Secondary | ICD-10-CM

## 2016-05-01 LAB — COMPREHENSIVE METABOLIC PANEL
ALT: 16 U/L (ref 0–35)
AST: 21 U/L (ref 0–37)
Albumin: 4.7 g/dL (ref 3.5–5.2)
Alkaline Phosphatase: 61 U/L (ref 39–117)
BUN: 14 mg/dL (ref 6–23)
CALCIUM: 9.7 mg/dL (ref 8.4–10.5)
CHLORIDE: 107 meq/L (ref 96–112)
CO2: 28 mEq/L (ref 19–32)
Creatinine, Ser: 0.68 mg/dL (ref 0.40–1.20)
GFR: 95.8 mL/min (ref >60.00)
Glucose, Bld: 88 mg/dL (ref 70–99)
Potassium: 3.6 mEq/L (ref 3.5–5.1)
Sodium: 141 mEq/L (ref 135–145)
Total Bilirubin: 0.7 mg/dL (ref 0.2–1.2)
Total Protein: 7.1 g/dL (ref 6.0–8.3)

## 2016-05-01 LAB — LIPID PANEL
CHOLESTEROL: 213 mg/dL — AB (ref 0–200)
HDL: 60.3 mg/dL (ref >39.00)
LDL CALC: 119 mg/dL — AB (ref 0–99)
NonHDL: 152.33
TRIGLYCERIDES: 169 mg/dL — AB (ref 0.0–149.0)
Total CHOL/HDL Ratio: 4
VLDL: 33.8 mg/dL (ref 0.0–40.0)

## 2016-05-01 NOTE — Telephone Encounter (Signed)
-----   Message from Marchia Bond sent at 04/30/2016  3:53 PM EDT ----- Regarding: Cpx labs tomorrow, need orders. Thanks! :-) Please order  future cpx labs for pt's upcoming lab appt. Thanks Aniceto Boss

## 2016-05-02 LAB — HEPATITIS C ANTIBODY: HCV Ab: NEGATIVE

## 2016-05-03 ENCOUNTER — Encounter: Payer: BLUE CROSS/BLUE SHIELD | Admitting: Family Medicine

## 2016-05-06 ENCOUNTER — Ambulatory Visit (INDEPENDENT_AMBULATORY_CARE_PROVIDER_SITE_OTHER): Payer: PRIVATE HEALTH INSURANCE | Admitting: Family Medicine

## 2016-05-06 ENCOUNTER — Encounter: Payer: Self-pay | Admitting: Family Medicine

## 2016-05-06 VITALS — BP 146/94 | HR 72 | Temp 98.4°F | Ht 62.5 in | Wt 128.5 lb

## 2016-05-06 DIAGNOSIS — L255 Unspecified contact dermatitis due to plants, except food: Secondary | ICD-10-CM

## 2016-05-06 DIAGNOSIS — Z0000 Encounter for general adult medical examination without abnormal findings: Secondary | ICD-10-CM

## 2016-05-06 DIAGNOSIS — I10 Essential (primary) hypertension: Secondary | ICD-10-CM

## 2016-05-06 DIAGNOSIS — Z1211 Encounter for screening for malignant neoplasm of colon: Secondary | ICD-10-CM

## 2016-05-06 DIAGNOSIS — E785 Hyperlipidemia, unspecified: Secondary | ICD-10-CM

## 2016-05-06 MED ORDER — LOSARTAN POTASSIUM-HCTZ 50-12.5 MG PO TABS: 1.0000 | ORAL_TABLET | Freq: Every day | ORAL | Status: DC

## 2016-05-06 MED ORDER — CLOBETASOL PROPIONATE 0.05 % EX CREA: 1.0000 "application " | TOPICAL_CREAM | Freq: Two times a day (BID) | CUTANEOUS | Status: DC

## 2016-05-06 NOTE — Assessment & Plan Note (Signed)
Treat with topical steroid cream.

## 2016-05-06 NOTE — Assessment & Plan Note (Signed)
SE to red yeast rice.. Try lower dose. Encouraged exercise, weight loss, healthy eating habits.

## 2016-05-06 NOTE — Assessment & Plan Note (Signed)
Well controlled. Continue current medication.  

## 2016-05-06 NOTE — Patient Instructions (Addendum)
Try a lower dose red yeast rice to see if tolerated. Work on low cholesterol, low fat diet.  Increase exercise as able.   Stop at lab on way out for stool colon cancer screen.  Call to schedule mammogram on your own.

## 2016-05-06 NOTE — Progress Notes (Signed)
The patient is here for annual wellness exam and preventative care.   No longer seeing GYN   Has poison ivy.. Needs refill of clobetasol 0.05%  Elevated Cholesterol: Stopped red yeast rice  Due to muscle ache, headache ( now resolved) and flax seed... LDL at goal < 130, trig elevated Lab Results  Component Value Date   CHOL 213* 05/01/2016   HDL 60.30 05/01/2016   LDLCALC 119* 05/01/2016   LDLDIRECT 126.8 08/25/2013   TRIG 169.0* 05/01/2016   CHOLHDL 4 05/01/2016   CAD Risks: Low HDL, no HTN, nonsmoker, >age 54, strong family history CAD Using medications without problems: Muscle aches: None Diet compliance:Moderate Exercise:walks 30 min 3 times a week. Other complaints:  Hypertension: Well controlled at home on losartan HCTZ.  BP Readings from Last 3 Encounters:   BP Readings from Last 3 Encounters:  02/03/15 140/90  10/20/14 126/82  09/24/13 120/66  Using medication without problems or lightheadedness: None Chest pain with exertion:None Edema:None Short of breath:None Average home BPs:130/70 Other issues:  Social History /Family History/Past Medical History reviewed and updated if needed.  Review of Systems  Constitutional: Negative for fever, fatigue and unexpected weight change.  HENT: Negative for ear pain, congestion, sore throat, sneezing, trouble swallowing and sinus pressure.  Eyes: Negative for pain and itching.  Respiratory: Negative for cough, shortness of breath and wheezing.  Cardiovascular: Negative for chest pain, palpitations and leg swelling.  Gastrointestinal: neg for D/C. Negative for nausea, abdominal pain, diarrhea and blood in stool.  Genitourinary: Negative for dysuria, hematuria, vaginal discharge, difficulty urinating and menstrual problem.  Skin: Negative for rash.  Neurological: Negative for syncope, weakness, light-headedness, numbness and headaches.  Psychiatric/Behavioral: Negative for confusion and dysphoric  mood. The patient is not nervous/anxious.      Objective:  Physical Exam  Constitutional: Vital signs are normal. She appears well-developed and well-nourished. She is cooperative. Non-toxic appearance. She does not appear ill. No distress.  HENT:  Head: Normocephalic.  Right Ear: Hearing, tympanic membrane, external ear and ear canal normal.  Left Ear: Hearing, tympanic membrane, external ear and ear canal normal.  Nose: Nose normal.  Eyes: Conjunctivae, EOM and lids are normal. Pupils are equal, round, and reactive to light. Lids are everted and swept, no foreign bodies found.  Fundoscopic exam:  The right eye shows no hemorrhage and no papilledema.   The left eye shows no hemorrhage and no papilledema.  Neck: Trachea normal and normal range of motion. Neck supple. Carotid bruit is not present. No mass and no thyromegaly present.  Cardiovascular: Normal rate, regular rhythm, S1 normal, S2 normal, normal heart sounds and intact distal pulses. Exam reveals no gallop.  No murmur heard. Pulmonary/Chest: Effort normal and breath sounds normal. No respiratory distress. She has no wheezes. She has no rhonchi. She has no rales.  Abdominal: Soft. Normal appearance and bowel sounds are normal. She exhibits no distension, no fluid wave, no abdominal bruit and no mass. There is no hepatosplenomegaly. There is no tenderness. There is no rebound, no guarding and no CVA tenderness. No hernia.  Lymphadenopathy:   She has no cervical adenopathy.   She has no axillary adenopathy.  Neurological: She is alert. She has normal strength and normal reflexes. No cranial nerve deficit or sensory deficit. She displays a negative Romberg sign.  Skin: Skin is warm, dry and intact. Hyperpigmented skin on arms back and cheeks of face. Psychiatric: Her speech is normal and behavior is normal. Judgment normal. Her mood appears not  anxious. Cognition and memory are normal. She does not exhibit  a depressed mood.   Breast exam: No mass, nodules, thickening, tenderness, bulging, retraction, inflamation, nipple discharge or skin changes noted.  No axillary or clavicular LA.  Chaperoned exam.   Assessment & Plan:  The patient's preventative maintenance and recommended screening tests for an annual wellness exam were reviewed in full today. Brought up to date unless services declined.  Counselled on the importance of diet, exercise, and its role in overall health and mortality. The patient's FH and SH was reviewed, including their home life, tobacco status, and drug and alcohol status.   Vaccines:  uptodate with TD. Colon: 2003 Dr. Olevia Perches, nml, recommend repeat in 10 years.. Given she cannot do this now we will do IFOB yearly. Mammo:12/2014, breast exam today Pap/DVE: [facility] for noncancer 2012, no indication for PAP/DVE.

## 2016-05-06 NOTE — Progress Notes (Signed)
Pre visit review using our clinic review tool, if applicable. No additional management support is needed unless otherwise documented below in the visit note. 

## 2016-06-21 ENCOUNTER — Encounter: Payer: BLUE CROSS/BLUE SHIELD | Admitting: Family Medicine

## 2017-04-05 ENCOUNTER — Encounter: Payer: Self-pay | Admitting: Family Medicine

## 2017-04-08 MED ORDER — HYDROQUINONE 4 % EX CREA: TOPICAL_CREAM | Freq: Two times a day (BID) | CUTANEOUS | 0 refills | Status: DC

## 2017-07-24 ENCOUNTER — Encounter: Payer: Self-pay | Admitting: Family Medicine

## 2017-07-24 MED ORDER — HYDROQUINONE 4 % EX CREA: TOPICAL_CREAM | Freq: Two times a day (BID) | CUTANEOUS | 0 refills | Status: DC

## 2017-07-24 NOTE — Telephone Encounter (Signed)
Last office visit 05/06/2016.  Last refilled 04/08/2017 for 28.35 g with no refills.  No future appointments.  Refills?

## 2017-07-31 ENCOUNTER — Ambulatory Visit (INDEPENDENT_AMBULATORY_CARE_PROVIDER_SITE_OTHER): Payer: PRIVATE HEALTH INSURANCE | Admitting: Obstetrics and Gynecology

## 2017-07-31 ENCOUNTER — Encounter: Payer: Self-pay | Admitting: Obstetrics and Gynecology

## 2017-07-31 VITALS — BP 138/82 | HR 72 | Resp 14 | Ht 62.5 in | Wt 131.0 lb

## 2017-07-31 DIAGNOSIS — L811 Chloasma: Secondary | ICD-10-CM | POA: Diagnosis not present

## 2017-07-31 DIAGNOSIS — Z1211 Encounter for screening for malignant neoplasm of colon: Secondary | ICD-10-CM

## 2017-07-31 DIAGNOSIS — Z90710 Acquired absence of both cervix and uterus: Secondary | ICD-10-CM

## 2017-07-31 DIAGNOSIS — N951 Menopausal and female climacteric states: Secondary | ICD-10-CM

## 2017-07-31 DIAGNOSIS — Z8679 Personal history of other diseases of the circulatory system: Secondary | ICD-10-CM | POA: Diagnosis not present

## 2017-07-31 DIAGNOSIS — Z23 Encounter for immunization: Secondary | ICD-10-CM | POA: Diagnosis not present

## 2017-07-31 DIAGNOSIS — Z01419 Encounter for gynecological examination (general) (routine) without abnormal findings: Secondary | ICD-10-CM | POA: Diagnosis not present

## 2017-07-31 MED ORDER — FLUOCIN-HYDROQUINONE-TRETINOIN 0.01-4-0.05 % EX CREA: TOPICAL_CREAM | CUTANEOUS | 1 refills | Status: DC

## 2017-07-31 NOTE — Patient Instructions (Signed)
EXERCISE AND DIET:  We recommended that you start or continue a regular exercise program for good health. Regular exercise means any activity that makes your heart beat faster and makes you sweat.  We recommend exercising at least 30 minutes per day at least 3 days a week, preferably 4 or 5.  We also recommend a diet low in fat and sugar.  Inactivity, poor dietary choices and obesity can cause diabetes, heart attack, stroke, and kidney damage, among others.    ALCOHOL AND SMOKING:  Women should limit their alcohol intake to no more than 7 drinks/beers/glasses of wine (combined, not each!) per week. Moderation of alcohol intake to this level decreases your risk of breast cancer and liver damage. And of course, no recreational drugs are part of a healthy lifestyle.  And absolutely no smoking or even second hand smoke. Most people know smoking can cause heart and lung diseases, but did you know it also contributes to weakening of your bones? Aging of your skin?  Yellowing of your teeth and nails?  CALCIUM AND VITAMIN D:  Adequate intake of calcium and Vitamin D are recommended.  The recommendations for exact amounts of these supplements seem to change often, but generally speaking 600 mg of calcium (either carbonate or citrate) and 800 units of Vitamin D per day seems prudent. Certain women may benefit from higher intake of Vitamin D.  If you are among these women, your doctor will have told you during your visit.    PAP SMEARS:  Pap smears, to check for cervical cancer or precancers,  have traditionally been done yearly, although recent scientific advances have shown that most women can have pap smears less often.  However, every woman still should have a physical exam from her gynecologist every year. It will include a breast check, inspection of the vulva and vagina to check for abnormal growths or skin changes, a visual exam of the cervix, and then an exam to evaluate the size and shape of the uterus and  ovaries.  And after 55 years of age, a rectal exam is indicated to check for rectal cancers. We will also provide age appropriate advice regarding health maintenance, like when you should have certain vaccines, screening for sexually transmitted diseases, bone density testing, colonoscopy, mammograms, etc.   MAMMOGRAMS:  All women over 40 years old should have a yearly mammogram. Many facilities now offer a "3D" mammogram, which may cost around $50 extra out of pocket. If possible,  we recommend you accept the option to have the 3D mammogram performed.  It both reduces the number of women who will be called back for extra views which then turn out to be normal, and it is better than the routine mammogram at detecting truly abnormal areas.    COLONOSCOPY:  Colonoscopy to screen for colon cancer is recommended for all women at age 50.  We know, you hate the idea of the prep.  We agree, BUT, having colon cancer and not knowing it is worse!!  Colon cancer so often starts as a polyp that can be seen and removed at colonscopy, which can quite literally save your life!  And if your first colonoscopy is normal and you have no family history of colon cancer, most women don't have to have it again for 10 years.  Once every ten years, you can do something that may end up saving your life, right?  We will be happy to help you get it scheduled when you are ready.    Be sure to check your insurance coverage so you understand how much it will cost.  It may be covered as a preventative service at no cost, but you should check your particular policy.      Breast Self-Awareness Breast self-awareness means being familiar with how your breasts look and feel. It involves checking your breasts regularly and reporting any changes to your health care provider. Practicing breast self-awareness is important. A change in your breasts can be a sign of a serious medical problem. Being familiar with how your breasts look and feel allows  you to find any problems early, when treatment is more likely to be successful. All women should practice breast self-awareness, including women who have had breast implants. How to do a breast self-exam One way to learn what is normal for your breasts and whether your breasts are changing is to do a breast self-exam. To do a breast self-exam: Look for Changes  1. Remove all the clothing above your waist. 2. Stand in front of a mirror in a room with good lighting. 3. Put your hands on your hips. 4. Push your hands firmly downward. 5. Compare your breasts in the mirror. Look for differences between them (asymmetry), such as: ? Differences in shape. ? Differences in size. ? Puckers, dips, and bumps in one breast and not the other. 6. Look at each breast for changes in your skin, such as: ? Redness. ? Scaly areas. 7. Look for changes in your nipples, such as: ? Discharge. ? Bleeding. ? Dimpling. ? Redness. ? A change in position. Feel for Changes  Carefully feel your breasts for lumps and changes. It is best to do this while lying on your back on the floor and again while sitting or standing in the shower or tub with soapy water on your skin. Feel each breast in the following way:  Place the arm on the side of the breast you are examining above your head.  Feel your breast with the other hand.  Start in the nipple area and make  inch (2 cm) overlapping circles to feel your breast. Use the pads of your three middle fingers to do this. Apply light pressure, then medium pressure, then firm pressure. The light pressure will allow you to feel the tissue closest to the skin. The medium pressure will allow you to feel the tissue that is a little deeper. The firm pressure will allow you to feel the tissue close to the ribs.  Continue the overlapping circles, moving downward over the breast until you feel your ribs below your breast.  Move one finger-width toward the center of the body.  Continue to use the  inch (2 cm) overlapping circles to feel your breast as you move slowly up toward your collarbone.  Continue the up and down exam using all three pressures until you reach your armpit.  Write Down What You Find  Write down what is normal for each breast and any changes that you find. Keep a written record with breast changes or normal findings for each breast. By writing this information down, you do not need to depend only on memory for size, tenderness, or location. Write down where you are in your menstrual cycle, if you are still menstruating. If you are having trouble noticing differences in your breasts, do not get discouraged. With time you will become more familiar with the variations in your breasts and more comfortable with the exam. How often should I examine my breasts? Examine   your breasts every month. If you are breastfeeding, the best time to examine your breasts is after a feeding or after using a breast pump. If you menstruate, the best time to examine your breasts is 5-7 days after your period is over. During your period, your breasts are lumpier, and it may be more difficult to notice changes. When should I see my health care provider? See your health care provider if you notice:  A change in shape or size of your breasts or nipples.  A change in the skin of your breast or nipples, such as a reddened or scaly area.  Unusual discharge from your nipples.  A lump or thick area that was not there before.  Pain in your breasts.  Anything that concerns you.  This information is not intended to replace advice given to you by your health care provider. Make sure you discuss any questions you have with your health care provider. Document Released: 11/18/2005 Document Revised: 04/25/2016 Document Reviewed: 10/08/2015 Elsevier Interactive Patient Education  2018 [employer]vier Inc.  

## 2017-07-31 NOTE — Progress Notes (Signed)
56 y.o. G1P0010 SingleAsianF here for annual exam.  H/O robotic total laparoscopic hysterectomy and LSO in 2012. Her right tube and ovary were absent at the time of her surgery.  She was on HRT for a while, had side effects so she went off several years ago. She continues to have hot flashes and night flashes all day and all night. Overall symptoms are tolerable. Over the counter meds didn't help. She is not sexually active. She has a partner, together for 30 years. For the last 10 years he is away, they are just friends.  She has melasma from her HRT, uses tri luma temporarily resolves it. She uses it intermittently    Patient's last menstrual period was 11/02/2011 (exact date).          Sexually active: No.  The current method of family planning is post menopausal status.    Exercising: Yes.    walking Smoker:  no  Health Maintenance: Pap:  02/28/11 Normal  History of abnormal Pap:  no MMG:  12/13/14 BIRADS1:neg  Colonoscopy:  06/22/02 Normal BMD:   Never TDaP:  2008   reports that she has never smoked. She has never used smokeless tobacco. She reports that she does not drink alcohol or use drugs. Living with her friend who has medical issues and is helping.  She works for a [employer].   Past Medical History:  Diagnosis Date  . Endometriosis   . Hyperlipidemia   . Hypertension    pt dx withi last week. Dr. Joan Flores RX blood pressure med. pt unsure name     Past Surgical History:  Procedure Laterality Date  . CYSTOSCOPY  11/19/2011   Procedure: CYSTOSCOPY;  Surgeon: Lubertha South Romine;  Location: [facility];  Service: Gynecology;  Laterality: N/A;  . ROBOTIC ASSISTED LAP VAGINAL HYSTERECTOMY Left 11/19/11   vag hyst/LSO    Current Outpatient Prescriptions  Medication Sig Dispense Refill  . aspirin 81 MG tablet Take 81 mg by mouth daily.    . Calcium Carbonate (CALCIUM 600 PO) Take 600 mg by mouth daily.    Marland Kitchen losartan-hydrochlorothiazide (HYZAAR) 50-12.5 MG tablet Take 1 tablet  by mouth daily. 30 tablet 11  . meloxicam (MOBIC) 15 MG tablet Take 1 tablet (15 mg total) by mouth daily. 30 tablet 0  . Multiple Vitamins-Minerals (CENTRUM ADULTS PO) Take by mouth.    . Omega 3 1000 MG CAPS Take by mouth daily.    . Potassium (POTASSIMIN PO) Take 100 mg by mouth daily.     No current facility-administered medications for this visit.   She is only taking her antihypertensive medications intermittently. She checks her BP at home. Most of the time her BP is 138-140/97  Family History  Problem Relation Age of Onset  . Stroke Mother   . Heart disease Mother   . Diabetes Father   . Kidney disease Father   . Hypertension Father   . Hyperlipidemia Father   . Coronary artery disease Father   . Hyperlipidemia Brother   . Hyperlipidemia Brother     Review of Systems  Constitutional: Negative.   HENT: Negative.   Eyes: Negative.   Respiratory: Negative.   Cardiovascular: Negative.   Gastrointestinal: Negative.   Endocrine: Positive for cold intolerance and heat intolerance.  Genitourinary: Negative.   Musculoskeletal: Negative.   Skin: Negative.   Allergic/Immunologic: Negative.   Neurological: Negative.   Hematological: Negative.   Psychiatric/Behavioral: Negative.     Exam:   BP 138/82 (BP Location:  Right Arm, Patient Position: Sitting, Cuff Size: Normal)   Pulse 72   Resp 14   Ht 5' 2.5" (1.588 m)   Wt 131 lb (59.4 kg)   LMP 11/02/2011 (Exact Date)   BMI 23.58 kg/m   Weight change: @WEIGHTCHANGE @ Height:   Height: 5' 2.5" (158.8 cm)  Ht Readings from Last 3 Encounters:  07/31/17 5' 2.5" (1.588 m)  05/06/16 5' 2.5" (1.588 m)  08/25/15 5' 2.5" (1.588 m)    General appearance: alert, cooperative and appears stated age Head: Normocephalic, without obvious abnormality, atraumatic Neck: no adenopathy, supple, symmetrical, trachea midline and thyroid normal to inspection and palpation Lungs: clear to auscultation bilaterally Cardiovascular: regular rate  and rhythm Breasts: normal appearance, no masses or tenderness Abdomen: soft, non-tender; bowel sounds normal; no masses,  no organomegaly Extremities: extremities normal, atraumatic, no cyanosis or edema Skin: Skin color, texture, turgor normal. She has significant melasma on bilateral checks. Lymph nodes: Cervical, supraclavicular, and axillary nodes normal. No abnormal inguinal nodes palpated Neurologic: Grossly normal   Pelvic: External genitalia:  no lesions              Urethra:  normal appearing urethra with no masses, tenderness or lesions              Bartholins and Skenes: normal                 Vagina: normal appearing atrophic vagina with normal color and discharge, no lesions              Cervix: absent               Bimanual Exam:  Uterus:  uterus absent              Adnexa: no mass, fullness, tenderness               Rectovaginal: Confirms               Anus:  normal sphincter tone, no lesions  Chaperone was present for exam.  A:  Well Woman with normal exam  Melasma  H/O HTN, reports that she only intermittently takes her anti HTN medication  P:   No pap  Recommended she f/u with her primary about her HTN, will do labs with her primary  IFOB given, she declines colonoscopy  Mammogram encouraged  Discussed breast self exam  Discussed calcium and vit D intake (she is taking a supplement)  TDAP today  Patient requests a script of Tri luma for her melasma. It is the only think that helps her. She uses it daily for up to 8 weeks, then intermittently.

## 2017-08-13 LAB — FECAL OCCULT BLOOD, IMMUNOCHEMICAL: IMMUNOLOGICAL FECAL OCCULT BLOOD TEST: NEGATIVE

## 2017-08-13 NOTE — Addendum Note (Signed)
Addended by: Terence Lux A on: 08/13/2017 10:31 AM   Modules accepted: Orders

## 2017-10-08 ENCOUNTER — Other Ambulatory Visit: Payer: Self-pay | Admitting: Family Medicine

## 2017-10-08 DIAGNOSIS — Z1231 Encounter for screening mammogram for malignant neoplasm of breast: Secondary | ICD-10-CM

## 2017-11-06 ENCOUNTER — Ambulatory Visit
Admission: RE | Admit: 2017-11-06 | Discharge: 2017-11-06 | Disposition: A | Discharge status: Home / Self Care | Payer: PRIVATE HEALTH INSURANCE | Source: Ambulatory Visit | Attending: Family Medicine | Admitting: Family Medicine

## 2017-11-06 DIAGNOSIS — Z1231 Encounter for screening mammogram for malignant neoplasm of breast: Secondary | ICD-10-CM

## 2019-03-17 ENCOUNTER — Other Ambulatory Visit: Payer: Self-pay

## 2019-03-17 ENCOUNTER — Ambulatory Visit: Payer: PRIVATE HEALTH INSURANCE | Admitting: Family Medicine

## 2019-03-17 NOTE — Telephone Encounter (Signed)
Please call this pt to set up HTN follow up with fasting curbside labs  prior

## 2019-03-17 NOTE — Telephone Encounter (Signed)
[name] is scheduled for [facility]oxy[facility] appointment Thursday at 9:30 am.

## 2019-03-18 ENCOUNTER — Telehealth: Payer: Self-pay | Admitting: Family Medicine

## 2019-03-18 ENCOUNTER — Ambulatory Visit (INDEPENDENT_AMBULATORY_CARE_PROVIDER_SITE_OTHER): Payer: PRIVATE HEALTH INSURANCE | Admitting: Family Medicine

## 2019-03-18 ENCOUNTER — Encounter: Payer: Self-pay | Admitting: Family Medicine

## 2019-03-18 VITALS — BP 146/95 | HR 90

## 2019-03-18 DIAGNOSIS — I10 Essential (primary) hypertension: Secondary | ICD-10-CM

## 2019-03-18 DIAGNOSIS — E782 Mixed hyperlipidemia: Secondary | ICD-10-CM | POA: Diagnosis not present

## 2019-03-18 MED ORDER — LOSARTAN POTASSIUM-HCTZ 50-12.5 MG PO TABS: 1.0000 | ORAL_TABLET | Freq: Every day | ORAL | 11 refills | Status: DC

## 2019-03-18 NOTE — Progress Notes (Signed)
VIRTUAL VISIT Due to national recommendations of social distancing due to [place] 19, a virtual visit is felt to be most appropriate for this patient at this time.   I connected with the patient on 03/18/19 at  9:30 AM EDT by virtual telehealth platform and verified that I am speaking with the correct person using two identifiers.   I discussed the limitations, risks, security and privacy concerns of performing an evaluation and management service by  virtual telehealth platform and the availability of in person appointments. I also discussed with the patient that there may be a patient responsible charge related to this service. The patient expressed understanding and agreed to proceed.  Patient location: Home Provider Location: [facility] Participants: [name] and [name]   Chief Complaint  Patient presents with  . Hypertension    History of Present Illness:  58 year old female presents  For HTN follow up.  She has not been see in 2 years. She has not had recent labs.  Hypertension:   Borderline control.. on losartan HCTZ.. did not take today... she only takes when BO higher. BP Readings from Last 3 Encounters:  03/18/19 (!) 146/95  07/31/17 138/82  05/06/16 (!) 146/94   No SE when taking it. Using medication without problems or lightheadedness: none Chest pain with exertion:none Edema:none Short of breath:none Average home BPs: daily BPs at home 138-139/89-90 Other issues:  She has not been eating well.. lots of sodium in diet.  Exercise: walking  2 miles a day, staying active.  Elevated Cholesterol:  Due for re-eval. Lab Results  Component Value Date   CHOL 213 (H) 05/01/2016   HDL 60.30 05/01/2016   LDLCALC 119 (H) 05/01/2016   LDLDIRECT 126.8 08/25/2013   TRIG 169.0 (H) 05/01/2016   CHOLHDL 4 05/01/2016  Using medications without problems: Muscle aches:  Diet compliance: high salt at times   COVID 19 screen No recent travel or known  exposure to [place] The patient denies respiratory symptoms of COVID 19 at this time.  The importance of social distancing was discussed today.   Review of Systems  Constitutional: Negative for chills and fever.  HENT: Negative for congestion and ear pain.   Eyes: Negative for pain and redness.  Respiratory: Negative for cough and shortness of breath.   Cardiovascular: Negative for chest pain, palpitations and leg swelling.  Gastrointestinal: Negative for abdominal pain, blood in stool, constipation, diarrhea, nausea and vomiting.  Genitourinary: Negative for dysuria.  Musculoskeletal: Negative for falls and myalgias.  Skin: Negative for rash.  Neurological: Negative for dizziness.  Psychiatric/Behavioral: Negative for depression. The patient is not nervous/anxious.       Past Medical History:  Diagnosis Date  . Endometriosis   . Hyperlipidemia   . Hypertension    pt dx withi last week. Dr. Joan Flores RX blood pressure med. pt unsure name     reports that she has never smoked. She has never used smokeless tobacco. She reports that she does not drink alcohol or use drugs.   Current Outpatient Medications:  .  aspirin 81 MG tablet, Take 81 mg by mouth daily., Disp: , Rfl:  .  Calcium Carbonate (CALCIUM 600 PO), Take 600 mg by mouth daily., Disp: , Rfl:  .  losartan-hydrochlorothiazide (HYZAAR) 50-12.5 MG tablet, Take 1 tablet by mouth daily., Disp: 30 tablet, Rfl: 11 .  Omega 3 1000 MG CAPS, Take by mouth daily., Disp: , Rfl:  .  TURMERIC PO, Take 1,000 mg by mouth  daily., Disp: , Rfl:  .  vitamin C (ASCORBIC ACID) 500 MG tablet, Take 500 mg by mouth daily., Disp: , Rfl:    Observations/Objective: Blood pressure (!) 146/95, pulse 90, last menstrual period 11/02/2011.  Physical Exam Constitutional:      General: She is not in acute distress. Pulmonary:     Effort: Pulmonary effort is normal. No respiratory distress.  Neurological:     Mental Status: She is alert and oriented to  person, place, and time.  Psychiatric:        Mood and Affect: Mood normal.        Behavior: Behavior normal.   Assessment and Plan   Hyperlipidemia Due for re-eval.  HTN (hypertension)  Pt to come in for BP check once taking BP med daily. Refilled losartan HCTZ. Reviewed correct way to take medication and encouraged her to take daily. Encouraged exercise, weight loss, healthy eating habits.    I discussed the assessment and treatment plan with the patient. The patient was provided an opportunity to ask questions and all were answered. The patient agreed with the plan and demonstrated an understanding of the instructions.   The patient was advised to call back or seek an in-person evaluation if the symptoms worsen or if the condition fails to improve as anticipated.     Faith Lofts, MD

## 2019-03-18 NOTE — Patient Instructions (Signed)
Make sure to take BP medication daily.  Encouraged exercise and healthy eating habits

## 2019-03-18 NOTE — Telephone Encounter (Signed)
I scheduled pt for nurse visit to check bp and labs on 4/28 @ 8:45

## 2019-03-18 NOTE — Assessment & Plan Note (Signed)
Due for re-eval. 

## 2019-03-18 NOTE — Assessment & Plan Note (Signed)
Pt to come in for BP check once taking BP med daily. Refilled losartan HCTZ. Reviewed correct way to take medication and encouraged her to take daily. Encouraged exercise, weight loss, healthy eating habits.

## 2019-03-25 ENCOUNTER — Other Ambulatory Visit: Payer: PRIVATE HEALTH INSURANCE

## 2019-03-25 ENCOUNTER — Ambulatory Visit: Payer: PRIVATE HEALTH INSURANCE

## 2019-03-30 ENCOUNTER — Telehealth: Payer: Self-pay | Admitting: Family Medicine

## 2019-03-30 ENCOUNTER — Ambulatory Visit (INDEPENDENT_AMBULATORY_CARE_PROVIDER_SITE_OTHER): Payer: PRIVATE HEALTH INSURANCE

## 2019-03-30 ENCOUNTER — Other Ambulatory Visit (INDEPENDENT_AMBULATORY_CARE_PROVIDER_SITE_OTHER): Payer: PRIVATE HEALTH INSURANCE

## 2019-03-30 VITALS — BP 126/92

## 2019-03-30 DIAGNOSIS — E782 Mixed hyperlipidemia: Secondary | ICD-10-CM | POA: Diagnosis not present

## 2019-03-30 DIAGNOSIS — I10 Essential (primary) hypertension: Secondary | ICD-10-CM

## 2019-03-30 LAB — COMPREHENSIVE METABOLIC PANEL
ALT: 16 U/L (ref 0–35)
AST: 18 U/L (ref 0–37)
Albumin: 4.4 g/dL (ref 3.5–5.2)
Alkaline Phosphatase: 73 U/L (ref 39–117)
BUN: 12 mg/dL (ref 6–23)
CO2: 30 mEq/L (ref 19–32)
Calcium: 9.3 mg/dL (ref 8.4–10.5)
Chloride: 102 mEq/L (ref 96–112)
Creatinine, Ser: 0.79 mg/dL (ref 0.40–1.20)
GFR: 75.01 mL/min (ref >60.00)
Glucose, Bld: 96 mg/dL (ref 70–99)
Potassium: 3.8 mEq/L (ref 3.5–5.1)
Sodium: 141 mEq/L (ref 135–145)
Total Bilirubin: 0.6 mg/dL (ref 0.2–1.2)
Total Protein: 7.1 g/dL (ref 6.0–8.3)

## 2019-03-30 LAB — LIPID PANEL
Cholesterol: 236 mg/dL — ABNORMAL HIGH (ref 0–200)
HDL: 47.7 mg/dL (ref >39.00)
NonHDL: 188.38
Total CHOL/HDL Ratio: 5
Triglycerides: 211 mg/dL — ABNORMAL HIGH (ref 0.0–149.0)
VLDL: 42.2 mg/dL — ABNORMAL HIGH (ref 0.0–40.0)

## 2019-03-30 LAB — LDL CHOLESTEROL, DIRECT: Direct LDL: 147 mg/dL

## 2019-03-30 NOTE — Progress Notes (Signed)
Per Dr. Diona Browner encounter order on 03/18/2019, patient presents today for a nurse visit blood pressure check for ongoing follow up and management.  Vital Sign Readings today B/P 134/92 right arm and 126/92 left arm.  Patient does need to know how to proceed from this point. Patient states her b/p at home in the left arm this morning was 111/79.

## 2019-03-30 NOTE — Progress Notes (Signed)
Patient advised and verbalized understanding 

## 2019-03-30 NOTE — Progress Notes (Signed)
BP at home running good, BP here only slightly elevated and may be due to process.  No change in medication, just make sure taking daily.

## 2019-03-30 NOTE — Telephone Encounter (Signed)
-----   Message from Ellamae Sia sent at 03/25/2019  3:38 PM EDT ----- Regarding: Lab orders for Tuesday, 4.28.20 Lab orders, thanks

## 2019-04-12 MED ORDER — PRAVASTATIN SODIUM 10 MG PO TABS: 10.0000 mg | ORAL_TABLET | Freq: Every day | ORAL | 11 refills | Status: DC

## 2019-05-04 ENCOUNTER — Encounter: Payer: Self-pay | Admitting: Family Medicine

## 2019-05-04 ENCOUNTER — Ambulatory Visit (INDEPENDENT_AMBULATORY_CARE_PROVIDER_SITE_OTHER): Payer: PRIVATE HEALTH INSURANCE | Admitting: Family Medicine

## 2019-05-04 VITALS — Ht 62.5 in

## 2019-05-04 DIAGNOSIS — R202 Paresthesia of skin: Secondary | ICD-10-CM | POA: Diagnosis not present

## 2019-05-04 NOTE — Progress Notes (Signed)
VIRTUAL VISIT Due to national recommendations of social distancing due to [place] 19, a virtual visit is felt to be most appropriate for this patient at this time.   I connected with the patient on 05/04/19 at  3:40 PM EDT by virtual telehealth platform and verified that I am speaking with the correct person using two identifiers.   I discussed the limitations, risks, security and privacy concerns of performing an evaluation and management service by  virtual telehealth platform and the availability of in person appointments. I also discussed with the patient that there may be a patient responsible charge related to this service. The patient expressed understanding and agreed to proceed.  Patient location: Home Provider Location: [facility] Participants: Eliezer Lofts and Natasha Mead   Chief Complaint  Patient presents with  . Tingling    head, arms, & legs    History of Present Illness: 58 year old female presents for  New onset tingling in head arms and legs.   She reports  It has been ongoing 3-4 years ago, infrequently... always occurred when lying on a limb in bed... head and both arms.   Lately she has noted more frequently... last 3-4 nights. Starts in head and goes to arms last 1-2 min. No weakness. No neck pain.  no issues in feet.. in winter feet are very cold despite socks.   Has also noted blurred vision off and on during the day... off and on for a whie... Had eye exam at opthamologist.. dx with dry eye.Marland Kitchen given refresh drops but this has not helped.  COVID 19 screen No recent travel or known exposure to COVID19 The patient denies respiratory symptoms of COVID 19 at this time.  The importance of social distancing was discussed today.   Review of Systems  Constitutional: Negative for chills and fever.  HENT: Negative for congestion and ear pain.   Eyes: Negative for pain and redness.  Respiratory: Negative for cough and shortness of breath.   Cardiovascular:  Negative for chest pain, palpitations and leg swelling.  Gastrointestinal: Negative for abdominal pain, blood in stool, constipation, diarrhea, nausea and vomiting.  Genitourinary: Negative for dysuria.  Musculoskeletal: Negative for falls and myalgias.  Skin: Negative for rash.  Neurological: Negative for dizziness.  Psychiatric/Behavioral: Negative for depression. The patient is not nervous/anxious.       Past Medical History:  Diagnosis Date  . Endometriosis   . Hyperlipidemia   . Hypertension    pt dx withi last week. Dr. Joan Flores RX blood pressure med. pt unsure name     reports that she has never smoked. She has never used smokeless tobacco. She reports that she does not drink alcohol or use drugs.   Current Outpatient Medications:  .  aspirin 81 MG tablet, Take 81 mg by mouth daily., Disp: , Rfl:  .  Calcium Carbonate (CALCIUM 600 PO), Take 600 mg by mouth daily., Disp: , Rfl:  .  losartan-hydrochlorothiazide (HYZAAR) 50-12.5 MG tablet, Take 1 tablet by mouth daily., Disp: 30 tablet, Rfl: 11 .  Omega 3 1000 MG CAPS, Take by mouth daily., Disp: , Rfl:  .  pravastatin (PRAVACHOL) 10 MG tablet, Take 1 tablet (10 mg total) by mouth daily., Disp: 30 tablet, Rfl: 11 .  TURMERIC PO, Take 1,000 mg by mouth daily., Disp: , Rfl:  .  vitamin C (ASCORBIC ACID) 500 MG tablet, Take 500 mg by mouth daily., Disp: , Rfl:    Observations/Objective: Height 5' 2.5" (1.588 m), last  menstrual period 11/02/2011.  Physical Exam  Physical Exam Constitutional:      General: The patient is not in acute distress. Pulmonary:     Effort: Pulmonary effort is normal. No respiratory distress.  Neurological:     Mental Status: The patient is alert and oriented to person, place, and time.  Psychiatric:        Mood and Affect: Mood normal.        Behavior: Behavior normal.  Assessment and Plan  Tingling in extremities Eval with labs. Not clearly due to circulation as pt concerned, but if not improving  offered in office visit.  No clear compression of cervical nerves.. no red flags. Consider referral to neuro of lab work up for thyroid and B12 nml.   I discussed the assessment and treatment plan with the patient. The patient was provided an opportunity to ask questions and all were answered. The patient agreed with the plan and demonstrated an understanding of the instructions.   The patient was advised to call back or seek an in-person evaluation if the symptoms worsen or if the condition fails to improve as anticipated.     Eliezer Lofts, MD

## 2019-05-04 NOTE — Assessment & Plan Note (Signed)
Eval with labs. Not clearly due to circulation as pt concerned, but if not improving offered in office visit.  No clear compression of cervical nerves.. no red flags. Consider referral to neuro of lab work up for thyroid and B12 nml.

## 2019-05-05 NOTE — Progress Notes (Signed)
Appointment 6/4 Pt aware

## 2019-05-06 ENCOUNTER — Other Ambulatory Visit (INDEPENDENT_AMBULATORY_CARE_PROVIDER_SITE_OTHER): Payer: PRIVATE HEALTH INSURANCE

## 2019-05-06 DIAGNOSIS — R202 Paresthesia of skin: Secondary | ICD-10-CM | POA: Diagnosis not present

## 2019-05-06 LAB — T4, FREE: Free T4: 0.8 ng/dL (ref 0.60–1.60)

## 2019-05-06 LAB — VITAMIN B12: Vitamin B-12: 246 pg/mL (ref 211–911)

## 2019-05-06 LAB — TSH: TSH: 1.8 u[IU]/mL (ref 0.35–4.50)

## 2019-05-06 LAB — T3, FREE: T3, Free: 3.3 pg/mL (ref 2.3–4.2)

## 2019-07-06 ENCOUNTER — Telehealth: Payer: Self-pay | Admitting: Obstetrics and Gynecology

## 2019-07-06 NOTE — Telephone Encounter (Signed)
Appointment Request From: Natasha Mead    With Provider: Salvadore Dom, MD [facility]]    Preferred Date Range: 07/06/2019 - 08/02/2019    Preferred Times: Any Time    Reason for visit: Request an Appointment    Comments:  Issues with hot flashes, brown stains on my face and body, unable to sleep, blurry vision and tired all the time. I want to test if my hormone is out of control

## 2019-07-07 NOTE — Telephone Encounter (Signed)
Call to patient. Patient requesting an appointment with Dr. Talbert Nan to discuss vasomotor symptoms of insomnia, hot flashes, melasma and blurred vision at times. Patient not currently using any HRT. Patient scheduled for 07-08-2019 at 1030. Patient declines recent exposure to anyone diagnosed with Covid, symptoms of Covid or international travel. Patient advised to wear an un-vented mask to appointment, practice social distancing and arrive 15 minutes early for appointment. Patient agreeable.   Routing to provider and will close encounter.

## 2019-07-07 NOTE — Progress Notes (Signed)
GYNECOLOGY  VISIT   HPI: 58 y.o.   Single Asian Not Hispanic or Latino  female   G1P0010 with Patient's last menstrual period was 11/02/2011 (exact date).  H/O robotic TLH/LSO in 2012, right tube and ovary were absent at the time of her surgery.  She was on HRT for a while, went off years ago secondary to side effects, including melasma and chest pain. here for vasomotor symptoms.  Her hot flashes come all day also gets them during the night. No sweating so much at night. She is waking up 3-4 x at night, turns on the fan, then wakes up cold. Tired because she isn't sleeping well.  She tried OTC agents without help.  Not sexually active, no vaginal c/o.   GYNECOLOGIC HISTORY: Patient's last menstrual period was 11/02/2011 (exact date). Contraception: Postmenopausal Menopausal hormone therapy: None        OB History    Gravida  1   Para  0   Term  0   Preterm  0   AB  1   Living  0     SAB      TAB  1   Ectopic      Multiple      Live Births                 Patient Active Problem List   Diagnosis Date Noted  . Tingling in extremities 04/18/2015  . HTN (hypertension) 11/29/2011  . Hyperlipidemia 08/26/2007    Past Medical History:  Diagnosis Date  . Endometriosis   . Hyperlipidemia   . Hypertension    pt dx withi last week. Dr. Joan Flores RX blood pressure med. pt unsure name     Past Surgical History:  Procedure Laterality Date  . CYSTOSCOPY  11/19/2011   Procedure: CYSTOSCOPY;  Surgeon: Lubertha South Romine;  Location: [facility];  Service: Gynecology;  Laterality: N/A;  . ROBOTIC ASSISTED LAP VAGINAL HYSTERECTOMY Left 11/19/11   vag hyst/LSO    Current Outpatient Medications  Medication Sig Dispense Refill  . aspirin 81 MG tablet Take 81 mg by mouth daily.    . Calcium Carbonate (CALCIUM 600 PO) Take 600 mg by mouth daily.    Marland Kitchen losartan-hydrochlorothiazide (HYZAAR) 50-12.5 MG tablet Take 1 tablet by mouth daily. 30 tablet 11  . Omega 3 1000 MG CAPS Take by  mouth daily.    . pravastatin (PRAVACHOL) 10 MG tablet Take 1 tablet (10 mg total) by mouth daily. 30 tablet 11  . TURMERIC PO Take 1,000 mg by mouth daily.    . vitamin C (ASCORBIC ACID) 500 MG tablet Take 500 mg by mouth daily.     No current facility-administered medications for this visit.      ALLERGIES: Lisinopril  Family History  Problem Relation Age of Onset  . Stroke Mother   . Heart disease Mother   . Diabetes Father   . Kidney disease Father   . Hypertension Father   . Hyperlipidemia Father   . Coronary artery disease Father   . Hyperlipidemia Brother   . Hyperlipidemia Brother   . Breast cancer Neg Hx     Social History   Socioeconomic History  . Marital status: Single    Spouse name: Not on file  . Number of children: 0  . Years of education: Not on file  . Highest education level: Not on file  Occupational History  . 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  Financial resource strain: Not on file  . Food insecurity    Worry: Not on file    Inability: Not on file  . Transportation needs    Medical: Not on file    Non-medical: Not on file  Tobacco Use  . Smoking status: Never Smoker  . Smokeless tobacco: Never Used  Substance and Sexual Activity  . Alcohol use: No  . Drug use: No  . Sexual activity: Not Currently    Birth control/protection: None  Lifestyle  . Physical activity    Days per week: Not on file    Minutes per session: Not on file  . Stress: Not on file  Relationships  . Social Herbalist on phone: Not on file    Gets together: Not on file    Attends religious service: Not on file    Active member of club or organization: Not on file    Attends meetings of clubs or organizations: Not on file    Relationship status: Not on file  . Intimate partner violence    Fear of current or ex partner: Not on file    Emotionally abused: Not on file    Physically abused: Not on file    Forced sexual activity:  Not on file  Other Topics Concern  . Not on file  Social History Narrative   Regular exercise--yes- yard work      Diet: fruits and veggies and oatmeal      Longterm relationship x 20 years    Review of Systems  Constitutional: Positive for malaise/fatigue.       Hot flashes  HENT: Negative.   Eyes: Negative.   Respiratory: Negative.   Cardiovascular: Negative.   Gastrointestinal: Negative.   Genitourinary: Negative.   Musculoskeletal: Negative.   Skin: Negative.   Neurological: Negative.   Endo/Heme/Allergies: Negative.   Psychiatric/Behavioral: Negative.     PHYSICAL EXAMINATION:    BP 122/78 (BP Location: Right Arm, Patient Position: Sitting, Cuff Size: Normal)   Pulse 76   Temp 98.2 F (36.8 C) (Skin)   Wt 129 lb 12.8 oz (58.9 kg)   LMP 11/02/2011 (Exact Date)   BMI 23.36 kg/m     General appearance: alert, cooperative and appears stated age   Cardiovascular risk calculator: 9.4% risk of CVD in the next 10 years.   ASSESSMENT Severe vasomotor symptoms, not tolerable, not sleeping H/O Hysterectomy, BSO Risk of CVD is 9.4% in the next 10 years Melasma, continues to worsen since she stopped ERT years ago    PLAN Discussed option of ERT, gabapentin, SSRI's. Discussed the risks of ERT. She would like to try gabapentin, will start at night 100 mg, increase every 3 days as needed to a total dose of up to 600 mg   An After Visit Summary was printed and given to the patient.  ~20 minutes face to face time of which over 50% was spent in counseling.

## 2019-07-07 NOTE — Telephone Encounter (Signed)
Patient returned call

## 2019-07-07 NOTE — Telephone Encounter (Signed)
Message left to return call to Triage Nurse at [phone].    

## 2019-07-08 ENCOUNTER — Ambulatory Visit (INDEPENDENT_AMBULATORY_CARE_PROVIDER_SITE_OTHER): Payer: No Typology Code available for payment source | Admitting: Obstetrics and Gynecology

## 2019-07-08 ENCOUNTER — Encounter: Payer: Self-pay | Admitting: Obstetrics and Gynecology

## 2019-07-08 ENCOUNTER — Other Ambulatory Visit: Payer: Self-pay

## 2019-07-08 VITALS — BP 122/78 | HR 76 | Temp 98.2°F | Wt 129.8 lb

## 2019-07-08 DIAGNOSIS — Z8639 Personal history of other endocrine, nutritional and metabolic disease: Secondary | ICD-10-CM

## 2019-07-08 DIAGNOSIS — Z8679 Personal history of other diseases of the circulatory system: Secondary | ICD-10-CM

## 2019-07-08 DIAGNOSIS — Z90710 Acquired absence of both cervix and uterus: Secondary | ICD-10-CM

## 2019-07-08 DIAGNOSIS — N951 Menopausal and female climacteric states: Secondary | ICD-10-CM | POA: Diagnosis not present

## 2019-07-08 DIAGNOSIS — L811 Chloasma: Secondary | ICD-10-CM

## 2019-07-08 MED ORDER — GABAPENTIN 100 MG PO CAPS: ORAL_CAPSULE | ORAL | 1 refills | Status: DC

## 2019-07-08 NOTE — Patient Instructions (Addendum)
Melasma Melasma is a skin condition that causes areas of darker coloring. It usually appears in patches on the cheeks, forehead, upper lip, and neck. These patches can look like a mask. The discolored areas do not itch and are not red or swollen. Melasma is not contagious. This means that it does not spread from person to person. What are the causes? The cause of this condition is not known. However, it can be started by certain triggers, such as:  Being out in the sun.  Allergies to medicines or cosmetics.  Changes in your hormones, such as: ? Taking birth control medicines. ? Taking hormone replacement therapy. ? Being pregnant. What increases the risk? The following factors may make you more likely to develop this condition:  Being a woman. Melasma is less common in men.  Having a family history of melasma.  Having darker skin.  Living in a tropical climate. What are the signs or symptoms? The only symptom of this condition is dark or tan patches on the skin. How is this diagnosed? This condition is diagnosed based on:  A physical exam. Your health care provider will examine the physical appearance of your skin. He or she may use an ultraviolet light, called a Wood lamp, to look more closely at your skin.  Biopsy. A small sample of your skin is taken and examined under a microscope. This is done to make sure your melasma is not caused by another skin condition, such as skin cancer. How is this treated? There is no cure for this condition. However, there are treatments that may lighten the color of the darker patches. Treatment may include:  Medicines, such as bleaching or steroid creams.  Facial or chemical peels.  Laser treatment.  Dermabrasion or microdermabrasion. These procedures use fine instruments to scrape and remove the outer layer of skin in order to grow new, healthy-looking skin. Your melasma may also go away on its own over time. Follow these instructions at  home:  Lifestyle  Avoid overexposure to the sun, especially in tropical areas.  Wear sunscreen with an SPF of 30 or higher every day.  Wear a hat that protects your face from the sun.  Use gentle cosmetics that are meant for sensitive skin.  Do not use wax to remove excess hair in areas where you have or have had melasma. General instructions  Take or apply over-the-counter and prescription medicines only as told by your health care provider.  Keep all follow-up visits as told by your health care provider. This is important. Contact a health care provider if:  You have new symptoms.  Your symptoms get worse.  Your affected skin areas are: ? Bleeding. ? Irritated. Summary  Melasma is a skin condition that causes areas of darker coloring that do not itch and are not red or swollen.  The cause of this condition is not known. However, it can be started by certain triggers such as sun exposure, allergies to medicines or cosmetics, or changes in your hormones.  Risk factors include being a woman, having a family history of melasma, having darker skin, or living in a tropical climate.  There is no cure for this condition. However, there are treatments that may lighten the color of the darker patches. They include medicine, facial or chemical peels, laser treatment, dermabrasion, or microdermabrasion. This information is not intended to replace advice given to you by your health care provider. Make sure you discuss any questions you have with your health care provider.  Document Released: 02/08/2003 Document Revised: 12/01/2017 Document Reviewed: 12/01/2017 Elsevier Patient Education  2020 [place]. Menopause and Hormone Replacement Therapy Menopause is a normal time of life when menstrual periods stop completely and the ovaries stop producing the female hormones estrogen and progesterone. This lack of hormones can affect your health and cause undesirable symptoms. Hormone  replacement therapy (HRT) can relieve some of those symptoms. What is hormone replacement therapy? HRT is the use of artificial (synthetic) hormones to replace hormones that your body has stopped producing because you have reached menopause. What are my options for HRT?  HRT may consist of the synthetic hormones estrogen and progestin, or it may consist of only estrogen (estrogen-only therapy). You and your health care provider will decide which form of HRT is best for you. If you choose to be on HRT and you have a uterus, estrogen and progestin are usually prescribed. Estrogen-only therapy is used for women who do not have a uterus. Possible options for taking HRT include:  Pills.  Patches.  Gels.  Sprays.  Vaginal cream.  Vaginal rings.  Vaginal inserts. The amount of hormone(s) that you take and how long you take the hormone(s) varies according to your health. It is important to:  Begin HRT with the lowest possible dosage.  Stop HRT as soon as your health care provider tells you to stop.  Work with your health care provider so that you feel informed and comfortable with your decisions. What are the benefits of HRT? HRT can reduce the frequency and severity of menopausal symptoms. Benefits of HRT vary according to the kind of symptoms that you have, how severe they are, and your overall health. HRT may help to improve the following symptoms of menopause:  Hot flashes and night sweats. These are sudden feelings of heat that spread over the face and body. The skin may turn red, like a blush. Night sweats are hot flashes that happen while you are sleeping or trying to sleep.  Bone loss (osteoporosis). The body loses calcium more quickly after menopause, causing the bones to become weaker. This can increase the risk for bone breaks (fractures).  Vaginal dryness. The lining of the vagina can become thin and dry, which can cause pain during sex or cause infection, burning, or itching.   Urinary tract infections.  Urinary incontinence. This is the inability to control when you pass urine.  Irritability.  Short-term memory problems. What are the risks of HRT? Risks of HRT vary depending on your individual health and medical history. Risks of HRT also depend on whether you receive both estrogen and progestin or you receive estrogen only. HRT may increase the risk of:  Spotting. This is when a small amount of blood leaks from the vagina unexpectedly.  Endometrial cancer. This cancer is in the lining of the uterus (endometrium).  Breast cancer.  Increased density of breast tissue. This can make it harder to find breast cancer on a breast X-ray (mammogram).  Stroke.  Heart disease.  Blood clots.  Gallbladder disease.  Liver disease. Risks of HRT can increase if you have any of the following conditions:  Endometrial cancer.  Liver disease.  Heart disease.  Breast cancer.  History of blood clots.  History of stroke. Follow these instructions at home:  Take over-the-counter and prescription medicines only as told by your health care provider.  Get mammograms, pelvic exams, and medical checkups as often as told by your health care provider.  Have Pap tests done as often  as told by your health care provider. A Pap test is sometimes called a Pap smear. It is a screening test that is used to check for signs of cancer of the cervix and vagina. A Pap test can also identify the presence of infection or precancerous changes. Pap tests may be done: ? Every 3 years, starting at age 45. ? Every 5 years, starting after age 64, in combination with testing for human papillomavirus (HPV). ? More often or less often depending on other medical conditions you have, your age, and other risk factors.  It is up to you to get the results of your Pap test. Ask your health care provider, or the department that is doing the test, when your results will be ready.  Keep all  follow-up visits as told by your health care provider. This is important. Contact a health care provider if you have:  Pain or swelling in your legs.  Shortness of breath.  Chest pain.  Lumps or changes in your breasts or armpits.  Slurred speech.  Pain, burning, or bleeding when you urinate.  Unusual vaginal bleeding.  Dizziness or headaches.  Weakness or numbness in any part of your arms or legs.  Pain in your abdomen. Summary  Menopause is a normal time of life when menstrual periods stop completely and the ovaries stop producing the female hormones estrogen and progesterone.  Hormone replacement therapy (HRT) can relieve some of the symptoms of menopause.  HRT can reduce the frequency and severity of menopausal symptoms.  Risks of HRT vary depending on your individual health and medical history. This information is not intended to replace advice given to you by your health care provider. Make sure you discuss any questions you have with your health care provider. Document Released: 08/17/2003 Document Revised: 07/21/2018 Document Reviewed: 07/21/2018 Elsevier Patient Education  2020 [employer].

## 2019-08-10 ENCOUNTER — Other Ambulatory Visit: Payer: Self-pay

## 2019-08-12 ENCOUNTER — Encounter: Payer: Self-pay | Admitting: Obstetrics and Gynecology

## 2019-08-12 ENCOUNTER — Ambulatory Visit (INDEPENDENT_AMBULATORY_CARE_PROVIDER_SITE_OTHER): Payer: No Typology Code available for payment source | Admitting: Obstetrics and Gynecology

## 2019-08-12 ENCOUNTER — Other Ambulatory Visit: Payer: Self-pay

## 2019-08-12 VITALS — BP 120/86 | HR 84 | Temp 97.1°F | Ht 62.5 in | Wt 128.0 lb

## 2019-08-12 DIAGNOSIS — N951 Menopausal and female climacteric states: Secondary | ICD-10-CM

## 2019-08-12 DIAGNOSIS — Z90710 Acquired absence of both cervix and uterus: Secondary | ICD-10-CM

## 2019-08-12 DIAGNOSIS — Z01419 Encounter for gynecological examination (general) (routine) without abnormal findings: Secondary | ICD-10-CM

## 2019-08-12 DIAGNOSIS — Z1211 Encounter for screening for malignant neoplasm of colon: Secondary | ICD-10-CM

## 2019-08-12 MED ORDER — GABAPENTIN 100 MG PO CAPS: ORAL_CAPSULE | ORAL | 1 refills | Status: DC

## 2019-08-12 NOTE — Progress Notes (Signed)
58 y.o. G1P0010 Single Asian Not Hispanic or Latino female here for annual exam.  H/O TLH/LSO in 2012, right tube and ovary were absent. She was started on gabapentin last month for severe vasomotor symptoms. She is currently taking 200 mg of gabapentin in the evening, helps her some nights. Not having a lot of night sweats. She is having hot flashes every few hours when she isn't working (work is very cold). Previously developed melasma on HRT. Risk of CVD is 9.4% in the next 10 years (based on calculation done last month). No bowel or bladder problems.  She lives with her long term partner of over 30 years, no longer sexually active.     Patient's last menstrual period was 11/02/2011 (exact date).          Sexually active: No.  The current method of family planning is status post hysterectomy.    Exercising: Yes.    walking Smoker:  no  Health Maintenance: Pap:  02/28/11 Normal  History of abnormal Pap:  no MMG:  11/06/2017 Birads 1 negative Colonoscopy:  06/22/02 WNL BMD:   Never TDaP:  07/14/2017 Gardasil: N/A   reports that she has never smoked. She has never used smokeless tobacco. She reports that she does not drink alcohol or use drugs.She works for a Medical sales representative.   Past Medical History:  Diagnosis Date  . Endometriosis   . Hyperlipidemia   . Hypertension     Past Surgical History:  Procedure Laterality Date  . CYSTOSCOPY  11/19/2011   Procedure: CYSTOSCOPY;  Surgeon: Lubertha South Romine;  Location: [facility];  Service: Gynecology;  Laterality: N/A;  . ROBOTIC ASSISTED LAP VAGINAL HYSTERECTOMY Left 11/19/11   vag hyst/LSO    Current Outpatient Medications  Medication Sig Dispense Refill  . aspirin 81 MG tablet Take 81 mg by mouth daily.    . Calcium Carbonate (CALCIUM 600 PO) Take 600 mg by mouth daily.    Marland Kitchen gabapentin (NEURONTIN) 100 MG capsule One tablet po qhs, can increase by one tablet every 3 days as needed to a total dose of 600 mg (6  tablets) 90 capsule 1  . losartan-hydrochlorothiazide (HYZAAR) 50-12.5 MG tablet Take 1 tablet by mouth daily. 30 tablet 11  . Omega 3 1000 MG CAPS Take by mouth daily.    . pravastatin (PRAVACHOL) 10 MG tablet Take 1 tablet (10 mg total) by mouth daily. 30 tablet 11  . TURMERIC PO Take 1,000 mg by mouth daily.    . vitamin C (ASCORBIC ACID) 500 MG tablet Take 500 mg by mouth daily.    Marland Kitchen VITAMIN D PO Take by mouth.     No current facility-administered medications for this visit.     Family History  Problem Relation Age of Onset  . Stroke Mother   . Heart disease Mother   . Diabetes Father   . Kidney disease Father   . Hypertension Father   . Hyperlipidemia Father   . Coronary artery disease Father   . Hyperlipidemia Brother   . Hyperlipidemia Brother   . Breast cancer Neg Hx     Review of Systems  Constitutional:       Hot flashes  HENT: Negative.   Eyes: Negative.   Respiratory: Negative.   Cardiovascular: Negative.   Gastrointestinal: Negative.   Endocrine: Negative.   Genitourinary: Negative.   Musculoskeletal: Negative.   Skin: Negative.   Allergic/Immunologic: Negative.   Neurological: Negative.   Hematological: Negative.  Psychiatric/Behavioral: Negative.     Exam:   BP 120/86 (BP Location: Right Arm, Patient Position: Sitting, Cuff Size: Normal)   Pulse 84   Temp (!) 97.1 F (36.2 C) (Skin)   Ht 5' 2.5" (1.588 m)   Wt 128 lb (58.1 kg)   LMP 11/02/2011 (Exact Date)   BMI 23.04 kg/m   Weight change: @WEIGHTCHANGE @ Height:   Height: 5' 2.5" (158.8 cm)  Ht Readings from Last 3 Encounters:  08/12/19 5' 2.5" (1.588 m)  05/04/19 5' 2.5" (1.588 m)  07/31/17 5' 2.5" (1.588 m)    General appearance: alert, cooperative and appears stated age Head: Normocephalic, without obvious abnormality, atraumatic Neck: no adenopathy, supple, symmetrical, trachea midline and thyroid normal to inspection and palpation Lungs: clear to auscultation  bilaterally Cardiovascular: regular rate and rhythm Breasts: normal appearance, no masses or tenderness Abdomen: soft, non-tender; non distended,  no masses,  no organomegaly Extremities: extremities normal, atraumatic, no cyanosis or edema Skin: Skin color, texture, turgor normal. No rashes or lesions Lymph nodes: Cervical, supraclavicular, and axillary nodes normal. No abnormal inguinal nodes palpated Neurologic: Grossly normal   Pelvic: External genitalia:  no lesions              Urethra:  normal appearing urethra with no masses, tenderness or lesions              Bartholins and Skenes: normal                 Vagina: atrophic appearing vagina with normal color and discharge, no lesions              Cervix: absent               Bimanual Exam:  Uterus:  uterus absent              Adnexa: no mass, fullness, tenderness               Rectovaginal: Confirms               Anus:  normal sphincter tone, no lesions  Chaperone was present for exam.  A:  Well Woman with normal exam  Vasomotor symptoms, sleeping better with 200 mg of gabapentin (takes in the evening), still with bad hotflases  Colon cancer screening, discussed options, she wants to do the IFOB, aware it isn't as good of a test  P:   She will start taking 100 mg of gabapentin in the am and continue with 200 mg in the evening. She will call for refills or adjustments.   IFOB  No pap  Mammogram overdue, she will schedule  Discussed breast self exam  Discussed calcium and vit D intake

## 2019-08-12 NOTE — Patient Instructions (Signed)

## 2019-08-30 LAB — FECAL OCCULT BLOOD, IMMUNOCHEMICAL: Fecal Occult Bld: NEGATIVE

## 2019-09-07 ENCOUNTER — Telehealth: Payer: Self-pay | Admitting: Family Medicine

## 2019-09-07 NOTE — Telephone Encounter (Signed)
She just needs her flu shot.  She will not start getting pneumonia vaccines until she is 58 years old.

## 2019-09-07 NOTE — Telephone Encounter (Signed)
Pt put in a request to get her pneumonia shot. Is she due for this?

## 2019-09-14 ENCOUNTER — Ambulatory Visit: Payer: PRIVATE HEALTH INSURANCE

## 2019-09-23 ENCOUNTER — Ambulatory Visit (INDEPENDENT_AMBULATORY_CARE_PROVIDER_SITE_OTHER): Payer: No Typology Code available for payment source

## 2019-09-23 DIAGNOSIS — Z23 Encounter for immunization: Secondary | ICD-10-CM | POA: Diagnosis not present

## 2020-02-03 ENCOUNTER — Ambulatory Visit: Payer: Self-pay

## 2020-02-07 ENCOUNTER — Ambulatory Visit: Payer: No Typology Code available for payment source | Attending: Internal Medicine

## 2020-02-07 DIAGNOSIS — Z23 Encounter for immunization: Secondary | ICD-10-CM | POA: Insufficient documentation

## 2020-02-07 NOTE — Progress Notes (Signed)
   Covid-19 Vaccination Clinic  Name:  Shanleigh Bridgeford    MRN: [id number] DOB: 08/03/1961  02/07/2020  Ms. Nabours was observed post Covid-19 immunization for 15 minutes without incident. She was provided with Vaccine Information Sheet and instruction to access the V-Safe system.   Ms. Iadarola was instructed to call 911 with any severe reactions post vaccine: Marland Kitchen Difficulty breathing  . Swelling of face and throat  . A fast heartbeat  . A bad rash all over body  . Dizziness and weakness   Immunizations Administered    Name Date Dose VIS Date Route   Pfizer COVID-19 Vaccine 02/07/2020  1:57 PM 0.3 mL 11/12/2019 Intramuscular   Manufacturer: [place]   Lot: [id number]   [place]: [id number]

## 2020-02-24 ENCOUNTER — Other Ambulatory Visit: Payer: Self-pay | Admitting: Family Medicine

## 2020-03-08 ENCOUNTER — Ambulatory Visit: Payer: No Typology Code available for payment source | Attending: Internal Medicine

## 2020-03-08 DIAGNOSIS — Z23 Encounter for immunization: Secondary | ICD-10-CM

## 2020-03-08 NOTE — Progress Notes (Signed)
   Covid-19 Vaccination Clinic  Name:  [name]    MRN: [id number] DOB: 1961/10/25  03/08/2020  [name] was observed post Covid-19 immunization for 15 minutes without incident. She was provided with Vaccine Information Sheet and instruction to access the V-Safe system.   [name] was instructed to call 911 with any severe reactions post vaccine: Marland Kitchen Difficulty breathing  . Swelling of face and throat  . A fast heartbeat  . A bad rash all over body  . Dizziness and weakness   Immunizations Administered    Name Date Dose VIS Date Route   Pfizer COVID-19 Vaccine 03/08/2020 12:21 PM 0.3 mL 11/12/2019 Intramuscular   Manufacturer: [place]   Lot: [id number]   [place]: [id number]

## 2020-04-01 ENCOUNTER — Other Ambulatory Visit: Payer: Self-pay | Admitting: Family Medicine

## 2020-04-03 NOTE — Telephone Encounter (Signed)
Please schedule [name] a CPE with fasting labs prior with Dr. Diona Browner.

## 2020-05-06 ENCOUNTER — Other Ambulatory Visit: Payer: Self-pay | Admitting: Family Medicine

## 2020-05-08 NOTE — Telephone Encounter (Signed)
Please schedule CPE with fasting labs prior with Dr. Bedsole.  

## 2020-05-11 ENCOUNTER — Other Ambulatory Visit: Payer: Self-pay

## 2020-05-12 MED ORDER — PRAVASTATIN SODIUM 10 MG PO TABS: 10.0000 mg | ORAL_TABLET | Freq: Every day | ORAL | 0 refills | Status: DC

## 2020-05-17 NOTE — Telephone Encounter (Signed)
Pt has been scheduled for a physical on 7/8 with labs on 7/1.

## 2020-05-31 ENCOUNTER — Telehealth: Payer: Self-pay | Admitting: Family Medicine

## 2020-05-31 DIAGNOSIS — E782 Mixed hyperlipidemia: Secondary | ICD-10-CM

## 2020-05-31 NOTE — Telephone Encounter (Signed)
-----   Message from Cloyd Stagers, RT sent at 05/17/2020 10:23 AM EDT ----- Regarding: Lab Orders for Thursday 7.1.2021 Please place lab orders for Thursday 7.1.2021, office visit for physical on Thursday 7.8.2021 Thank you, Dyke Maes RT(R)

## 2020-06-01 ENCOUNTER — Other Ambulatory Visit (INDEPENDENT_AMBULATORY_CARE_PROVIDER_SITE_OTHER): Payer: No Typology Code available for payment source

## 2020-06-01 DIAGNOSIS — E782 Mixed hyperlipidemia: Secondary | ICD-10-CM

## 2020-06-01 LAB — COMPREHENSIVE METABOLIC PANEL
ALT: 13 U/L (ref 0–35)
AST: 15 U/L (ref 0–37)
Albumin: 4.5 g/dL (ref 3.5–5.2)
Alkaline Phosphatase: 52 U/L (ref 39–117)
BUN: 20 mg/dL (ref 6–23)
CO2: 30 mEq/L (ref 19–32)
Calcium: 9.5 mg/dL (ref 8.4–10.5)
Chloride: 104 mEq/L (ref 96–112)
Creatinine, Ser: 0.8 mg/dL (ref 0.40–1.20)
GFR: 73.62 mL/min (ref >60.00)
Glucose, Bld: 106 mg/dL — ABNORMAL HIGH (ref 70–99)
Potassium: 4 mEq/L (ref 3.5–5.1)
Sodium: 142 mEq/L (ref 135–145)
Total Bilirubin: 0.6 mg/dL (ref 0.2–1.2)
Total Protein: 7.3 g/dL (ref 6.0–8.3)

## 2020-06-01 LAB — LIPID PANEL
Cholesterol: 207 mg/dL — ABNORMAL HIGH (ref 0–200)
HDL: 47.8 mg/dL (ref >39.00)
LDL Cholesterol: 122 mg/dL — ABNORMAL HIGH (ref 0–99)
NonHDL: 158.79
Total CHOL/HDL Ratio: 4
Triglycerides: 184 mg/dL — ABNORMAL HIGH (ref 0.0–149.0)
VLDL: 36.8 mg/dL (ref 0.0–40.0)

## 2020-06-02 NOTE — Progress Notes (Signed)
No critical labs need to be addressed urgently. We will discuss labs in detail at upcoming office visit.   

## 2020-06-07 ENCOUNTER — Other Ambulatory Visit: Payer: Self-pay

## 2020-06-08 ENCOUNTER — Encounter: Payer: Self-pay | Admitting: Family Medicine

## 2020-06-08 ENCOUNTER — Ambulatory Visit (INDEPENDENT_AMBULATORY_CARE_PROVIDER_SITE_OTHER): Payer: No Typology Code available for payment source | Admitting: Family Medicine

## 2020-06-08 ENCOUNTER — Other Ambulatory Visit: Payer: Self-pay

## 2020-06-08 VITALS — BP 122/70 | HR 73 | Temp 97.6°F | Ht 63.0 in | Wt 132.8 lb

## 2020-06-08 DIAGNOSIS — Z0000 Encounter for general adult medical examination without abnormal findings: Secondary | ICD-10-CM

## 2020-06-08 DIAGNOSIS — M222X1 Patellofemoral disorders, right knee: Secondary | ICD-10-CM | POA: Diagnosis not present

## 2020-06-08 DIAGNOSIS — I10 Essential (primary) hypertension: Secondary | ICD-10-CM

## 2020-06-08 DIAGNOSIS — Z1211 Encounter for screening for malignant neoplasm of colon: Secondary | ICD-10-CM

## 2020-06-08 DIAGNOSIS — M222X2 Patellofemoral disorders, left knee: Secondary | ICD-10-CM

## 2020-06-08 DIAGNOSIS — E782 Mixed hyperlipidemia: Secondary | ICD-10-CM

## 2020-06-08 NOTE — Patient Instructions (Addendum)
Keep working on [employer] and regular exercise  Can use glucosomine for knee stiffness and pain in knees from likely patellofemoral syndrome. Can use diclofenac cream for pain and inflammation.  Return stool cards for co cancer screening... pick up at lab on way out.  Please call the location of your choice from the menu below to schedule your Mammogram and/or Bone Density appointment.    [facility]   1. [facility]                      Phone:  [phone], [place] [address]                                                             Services: Traditional and 3D Mammogram, Bone Density   2. [facility]                 Phone: [phone] [address], [place] [address]    Service: Bone Density ONLY   *this site does NOT perform mammograms  3. [facility]                        Phone:  [phone] [address] [place], [place] [address]                                            Services:  3D Mammogram and Bone Density    [place]  1. [place] [facility]   Phone:  [phone]   [place], [place] [address]                                            Services: 3D Mammogram and Bone Density  2. [place] at [facility])  Phone:  [phone]   [address] [place], [place] [address]                                              Services:  3D Mammogram and Bone Density

## 2020-06-08 NOTE — Progress Notes (Signed)
Chief Complaint  Patient presents with  . Annual Exam    Knee stiffness    History of Present Illness: HPI  The patient is here for annual wellness exam and preventative care.    Hypertension:   At goal on losartan HCTZ BP Readings from Last 3 Encounters:  06/08/20 122/70  08/12/19 120/86  07/08/19 122/78  Using medication without problems or lightheadedness:  none Chest pain with exertion: none Edema:none Short of breath: none Average home BPs: 120/70 Other issues:  Elevated Cholesterol:  LD at goal  On pravastatin Lab Results  Component Value Date   CHOL 207 (H) 06/01/2020   HDL 47.80 06/01/2020   LDLCALC 122 (H) 06/01/2020   LDLDIRECT 147.0 03/30/2019   TRIG 184.0 (H) 06/01/2020   CHOLHDL 4 06/01/2020  Using medications without problems: Muscle aches:  Diet compliance: Exercise: walks several times a week Other complaints:  The 10-year ASCVD risk score Mikey Bussing DC Brooke Bonito., et al., 2013) is: 3.8%   Values used to calculate the score:     Age: 59 years     Sex: Female     Is Non-Hispanic African American: No     Diabetic: No     Tobacco smoker: No     Systolic Blood Pressure: 638 mmHg     Is BP treated: Yes     HDL Cholesterol: 47.8 mg/dL     Total Cholesterol: 207 mg/dL   This visit occurred during the SARS-CoV-2 public health emergency.  Safety protocols were in place, including screening questions prior to the visit, additional usage of staff PPE, and extensive cleaning of exam room while observing appropriate contact time as indicated for disinfecting solutions.   COVID 19 screen:  No recent travel or known exposure to COVID19 The patient denies respiratory symptoms of COVID 19 at this time. The importance of social distancing was discussed today.     Review of Systems  Constitutional: Negative for chills and fever.  HENT: Negative for congestion and ear pain.   Eyes: Negative for pain and redness.  Respiratory: Negative for cough and shortness of  breath.   Cardiovascular: Negative for chest pain, palpitations and leg swelling.  Gastrointestinal: Negative for abdominal pain, blood in stool, constipation, diarrhea, nausea and vomiting.  Genitourinary: Negative for dysuria.  Musculoskeletal: Negative for falls and myalgias.  Skin: Negative for rash.  Neurological: Negative for dizziness.  Psychiatric/Behavioral: Negative for depression. The patient is not nervous/anxious.       Past Medical History:  Diagnosis Date  . Endometriosis   . Hyperlipidemia   . Hypertension     reports that she has never smoked. She has never used smokeless tobacco. She reports that she does not drink alcohol and does not use drugs.   Current Outpatient Medications:  .  aspirin 81 MG tablet, Take 81 mg by mouth daily., Disp: , Rfl:  .  Calcium Carbonate (CALCIUM 600 PO), Take 600 mg by mouth daily., Disp: , Rfl:  .  losartan-hydrochlorothiazide (HYZAAR) 50-12.5 MG tablet, Take 1 tablet by mouth once daily, Disp: 90 tablet, Rfl: 0 .  Omega 3 1000 MG CAPS, Take by mouth daily., Disp: , Rfl:  .  pravastatin (PRAVACHOL) 10 MG tablet, Take 1 tablet by mouth once daily, Disp: 30 tablet, Rfl: 0 .  TURMERIC PO, Take 1,000 mg by mouth daily., Disp: , Rfl:  .  vitamin C (ASCORBIC ACID) 500 MG tablet, Take 500 mg by mouth daily., Disp: , Rfl:  .  VITAMIN D PO,  Take by mouth., Disp: , Rfl:    Observations/Objective: Blood pressure 122/70, pulse 73, temperature 97.6 F (36.4 C), temperature source Temporal, height 5\' 3"  (1.6 m), weight 132 lb 12.8 oz (60.2 kg), last menstrual period 11/02/2011, SpO2 99 %.  Physical Exam Constitutional:      General: She is not in acute distress.    Appearance: Normal appearance. She is well-developed. She is not ill-appearing or toxic-appearing.  HENT:     Head: Normocephalic.     Right Ear: Hearing, tympanic membrane, ear canal and external ear normal.     Left Ear: Hearing, tympanic membrane, ear canal and external ear  normal.     Nose: Nose normal.  Eyes:     General: Lids are normal. Lids are everted, no foreign bodies appreciated.     Conjunctiva/sclera: Conjunctivae normal.     Pupils: Pupils are equal, round, and reactive to light.  Neck:     Thyroid: No thyroid mass or thyromegaly.     Vascular: No carotid bruit.     Trachea: Trachea normal.  Cardiovascular:     Rate and Rhythm: Normal rate and regular rhythm.     Heart sounds: Normal heart sounds, S1 normal and S2 normal. No murmur heard.  No gallop.   Pulmonary:     Effort: Pulmonary effort is normal. No respiratory distress.     Breath sounds: Normal breath sounds. No wheezing, rhonchi or rales.  Abdominal:     General: Bowel sounds are normal. There is no distension or abdominal bruit.     Palpations: Abdomen is soft. There is no fluid wave or mass.     Tenderness: There is no abdominal tenderness. There is no guarding or rebound.     Hernia: No hernia is present.  Musculoskeletal:     Cervical back: Normal range of motion and neck supple.     Right knee: Crepitus present.     Left knee: Crepitus present.  Lymphadenopathy:     Cervical: No cervical adenopathy.  Skin:    General: Skin is warm and dry.     Findings: No rash.  Neurological:     Mental Status: She is alert.     Cranial Nerves: No cranial nerve deficit.     Sensory: No sensory deficit.  Psychiatric:        Mood and Affect: Mood is not anxious or depressed.        Speech: Speech normal.        Behavior: Behavior normal. Behavior is cooperative.        Judgment: Judgment normal.      Assessment and Plan    The patient's preventative maintenance and recommended screening tests for an annual wellness exam were reviewed in full today. Brought up to date unless services declined.  Counselled on the importance of diet, exercise, and its role in overall health and mortality. The patient's FH and SH was reviewed, including their home life, tobacco status, and drug and  alcohol status.    Vaccines:  uptodate with TD, COVID19 Colon: 2003 Dr. Olevia Perches, nml, recommend repeat in 10 years.. Given she cannot do this now we will do IFOB yearly... neg 08/2019 Mammo:11/2017 Pap/DVE: [facility] for noncancer 2012  08/2019 GYN exam  Uptodate with COVID19 vaccine and hep C screening Refused HIV testing.  HTN (hypertension) Well controlled. Continue current medication.   Hyperlipidemia LDL at goal on pravastatin  Patellofemoral syndrome of both knees Can use glucosomine for knee stiffness and pain in knees  from likely patellofemoral syndrome. Can use diclofenac cream for pain and inflammation.      Eliezer Lofts, MD

## 2020-06-09 ENCOUNTER — Other Ambulatory Visit: Payer: Self-pay

## 2020-06-10 ENCOUNTER — Other Ambulatory Visit: Payer: Self-pay

## 2020-06-12 MED ORDER — PRAVASTATIN SODIUM 10 MG PO TABS: 10.0000 mg | ORAL_TABLET | Freq: Every day | ORAL | 3 refills | Status: DC

## 2020-06-12 MED ORDER — LOSARTAN POTASSIUM-HCTZ 50-12.5 MG PO TABS: 1.0000 | ORAL_TABLET | Freq: Every day | ORAL | 3 refills | Status: DC

## 2020-06-13 ENCOUNTER — Other Ambulatory Visit (INDEPENDENT_AMBULATORY_CARE_PROVIDER_SITE_OTHER): Payer: No Typology Code available for payment source

## 2020-06-13 DIAGNOSIS — Z1211 Encounter for screening for malignant neoplasm of colon: Secondary | ICD-10-CM

## 2020-06-13 LAB — FECAL OCCULT BLOOD, IMMUNOCHEMICAL: Fecal Occult Bld: NEGATIVE

## 2020-08-07 DIAGNOSIS — M222X1 Patellofemoral disorders, right knee: Secondary | ICD-10-CM | POA: Insufficient documentation

## 2020-08-07 NOTE — Assessment & Plan Note (Signed)
LDL at goal on pravastatin 

## 2020-08-07 NOTE — Assessment & Plan Note (Signed)
Can use glucosomine for knee stiffness and pain in knees from likely patellofemoral syndrome. Can use diclofenac cream for pain and inflammation.

## 2020-08-07 NOTE — Assessment & Plan Note (Signed)
Well controlled. Continue current medication.  

## 2021-03-20 ENCOUNTER — Ambulatory Visit: Payer: No Typology Code available for payment source | Admitting: Family Medicine

## 2021-04-24 ENCOUNTER — Ambulatory Visit (INDEPENDENT_AMBULATORY_CARE_PROVIDER_SITE_OTHER)
Admission: RE | Admit: 2021-04-24 | Discharge: 2021-04-24 | Disposition: A | Discharge status: Home / Self Care | Payer: No Typology Code available for payment source | Source: Ambulatory Visit | Attending: Family Medicine | Admitting: Family Medicine

## 2021-04-24 ENCOUNTER — Encounter: Payer: Self-pay | Admitting: Family Medicine

## 2021-04-24 ENCOUNTER — Ambulatory Visit (INDEPENDENT_AMBULATORY_CARE_PROVIDER_SITE_OTHER): Payer: No Typology Code available for payment source | Admitting: Family Medicine

## 2021-04-24 ENCOUNTER — Other Ambulatory Visit: Payer: Self-pay

## 2021-04-24 DIAGNOSIS — M79645 Pain in left finger(s): Secondary | ICD-10-CM | POA: Diagnosis not present

## 2021-04-24 DIAGNOSIS — M25562 Pain in left knee: Secondary | ICD-10-CM

## 2021-04-24 DIAGNOSIS — M5442 Lumbago with sciatica, left side: Secondary | ICD-10-CM | POA: Diagnosis not present

## 2021-04-24 MED ORDER — PREDNISONE 20 MG PO TABS
ORAL_TABLET | ORAL | 0 refills | Status: DC
Start: 2021-04-24 — End: 2021-05-31

## 2021-04-24 NOTE — Progress Notes (Signed)
Patient ID: [name], female    DOB: Dec 30, 1960, 60 y.o.   MRN: [id number]  This visit was conducted in person.  BP 120/70   Pulse 83   Temp 98.3 F (36.8 C) (Temporal)   Ht 5\' 3"  (1.6 m)   Wt 133 lb 8 oz (60.6 kg)   LMP 11/02/2011 (Exact Date)   SpO2 98%   BMI 23.65 kg/m    CC:  Chief Complaint  Patient presents with   Hand Pain    Left Index finger   Leg Pain    Left   Knee Pain    Left    Subjective:   HPI: [name] is a 60 y.o. female presenting on 04/24/2021 for Hand Pain (Left Index finger), Leg Pain (Left), and Knee Pain (Left)   She noted in middle of April new onset pain in left index finger... pain to bend PIP.  She thought it might be from computer work.  Still some stiffness but pain resolved. No swelling.  Also at same time noted left lateral thigh pain, left knee.  Pain in left knee with walking, limping and using a cane. No redness, nontender to touch. She cannot straighten left leg.  Did have some low back pain and left buttock pain... improved with aleve. No fall, no injury.  She has been using salonpas. And aleve 2 tabs every 2 hours in last 6 days. Had some improvement     Relevant past medical, surgical, family and social history reviewed and updated as indicated. Interim medical history since our last visit reviewed. Allergies and medications reviewed and updated. Outpatient Medications Prior to Visit  Medication Sig Dispense Refill   aspirin 81 MG tablet Take 81 mg by mouth daily.     b complex vitamins capsule Take 1 capsule by mouth daily.     Calcium Carbonate (CALCIUM 600 PO) Take 600 mg by mouth daily.     losartan-hydrochlorothiazide (HYZAAR) 50-12.5 MG tablet Take 1 tablet by mouth daily. 90 tablet 3   Misc Natural Products (JOINT HEALTH PO) Take 2 tablets by mouth daily.     Omega 3 1000 MG CAPS Take by mouth daily.     pravastatin (PRAVACHOL) 10 MG tablet Take 1 tablet (10 mg total) by mouth daily. 90 tablet 3    TURMERIC PO Take 1,000 mg by mouth daily.     vitamin C (ASCORBIC ACID) 500 MG tablet Take 500 mg by mouth daily.     VITAMIN D PO Take by mouth.     No facility-administered medications prior to visit.     Per HPI unless specifically indicated in ROS section below Review of Systems  Constitutional:  Negative for fatigue and fever.  HENT:  Negative for congestion.   Eyes:  Negative for pain.  Respiratory:  Negative for cough and shortness of breath.   Cardiovascular:  Negative for chest pain, palpitations and leg swelling.  Gastrointestinal:  Negative for abdominal pain.  Genitourinary:  Negative for dysuria and vaginal bleeding.  Musculoskeletal:  Negative for back pain.  Neurological:  Negative for syncope, light-headedness and headaches.  Psychiatric/Behavioral:  Negative for dysphoric mood.   Objective:  BP 120/70   Pulse 83   Temp 98.3 F (36.8 C) (Temporal)   Ht 5\' 3"  (1.6 m)   Wt 133 lb 8 oz (60.6 kg)   LMP 11/02/2011 (Exact Date)   SpO2 98%   BMI 23.65 kg/m   Wt Readings from Last 3 Encounters:  04/24/21  133 lb 8 oz (60.6 kg)  06/08/20 132 lb 12.8 oz (60.2 kg)  08/12/19 128 lb (58.1 kg)      Physical Exam Constitutional:      General: She is not in acute distress.    Appearance: Normal appearance. She is well-developed. She is not ill-appearing or toxic-appearing.  HENT:     Head: Normocephalic.     Right Ear: Hearing, tympanic membrane, ear canal and external ear normal. Tympanic membrane is not erythematous, retracted or bulging.     Left Ear: Hearing, tympanic membrane, ear canal and external ear normal. Tympanic membrane is not erythematous, retracted or bulging.     Nose: No mucosal edema or rhinorrhea.     Right Sinus: No maxillary sinus tenderness or frontal sinus tenderness.     Left Sinus: No maxillary sinus tenderness or frontal sinus tenderness.     Mouth/Throat:     Pharynx: Uvula midline.  Eyes:     General: Lids are normal. Lids are everted, no  foreign bodies appreciated.     Conjunctiva/sclera: Conjunctivae normal.     Pupils: Pupils are equal, round, and reactive to light.  Neck:     Thyroid: No thyroid mass or thyromegaly.     Vascular: No carotid bruit.     Trachea: Trachea normal.  Cardiovascular:     Rate and Rhythm: Normal rate and regular rhythm.     Pulses: Normal pulses.     Heart sounds: Normal heart sounds, S1 normal and S2 normal. No murmur heard.   No friction rub. No gallop.  Pulmonary:     Effort: Pulmonary effort is normal. No tachypnea or respiratory distress.     Breath sounds: Normal breath sounds. No decreased breath sounds, wheezing, rhonchi or rales.  Abdominal:     General: Bowel sounds are normal.     Palpations: Abdomen is soft.     Tenderness: There is no abdominal tenderness.  Musculoskeletal:     Cervical back: Normal range of motion and neck supple.     Left knee: Bony tenderness present. No crepitus. Decreased range of motion. No tenderness.     Comments: Left index finger with decreased ROM at PIP joint, no redness, no swelling.  Skin:    General: Skin is warm and dry.     Findings: No rash.  Neurological:     Mental Status: She is alert.     Sensory: Sensation is intact.     Motor: Motor function is intact.     Gait: Gait abnormal.  Psychiatric:        Mood and Affect: Mood is not anxious or depressed.        Speech: Speech normal.        Behavior: Behavior normal. Behavior is cooperative.        Thought Content: Thought content normal.        Judgment: Judgment normal.      Results for orders placed or performed in visit on 06/13/20  Fecal occult blood, imunochemical   Specimen: Stool  Result Value Ref Range   Fecal Occult Bld Negative Negative    This visit occurred during the SARS-CoV-2 public health emergency.  Safety protocols were in place, including screening questions prior to the visit, additional usage of staff PPE, and extensive cleaning of exam room while observing  appropriate contact time as indicated for disinfecting solutions.   COVID 19 screen:  No recent travel or known exposure to [place] The patient denies respiratory symptoms of  COVID 19 at this time. The importance of social distancing was discussed today.   Assessment and Plan Problem List Items Addressed This Visit   None Visit Diagnoses     Acute pain of left knee       Relevant Orders   DG Knee Complete 4 Views Left (Completed)   Acute left-sided low back pain with left-sided sciatica       Finger pain, left          Complete course of prednisone. Once completed can restart aleve as needed.  We will call with X-ray results.   Start gentle stretching of left knee and low back.     Eliezer Lofts, MD

## 2021-04-24 NOTE — Patient Instructions (Signed)
Complete course of prednisone. Once completed can restart aleve as needed.  We will call with X-ray results.   Start gentle stretching of left knee and low back.

## 2021-04-26 ENCOUNTER — Ambulatory Visit: Payer: No Typology Code available for payment source | Admitting: Family Medicine

## 2021-05-01 ENCOUNTER — Other Ambulatory Visit: Payer: Self-pay | Admitting: Family Medicine

## 2021-05-01 DIAGNOSIS — M25562 Pain in left knee: Secondary | ICD-10-CM | POA: Insufficient documentation

## 2021-05-01 DIAGNOSIS — M2342 Loose body in knee, left knee: Secondary | ICD-10-CM

## 2021-05-10 ENCOUNTER — Ambulatory Visit (INDEPENDENT_AMBULATORY_CARE_PROVIDER_SITE_OTHER): Payer: No Typology Code available for payment source | Admitting: Orthopaedic Surgery

## 2021-05-10 ENCOUNTER — Encounter: Payer: Self-pay | Admitting: Orthopaedic Surgery

## 2021-05-10 DIAGNOSIS — M1712 Unilateral primary osteoarthritis, left knee: Secondary | ICD-10-CM | POA: Diagnosis not present

## 2021-05-10 MED ORDER — METHYLPREDNISOLONE ACETATE 40 MG/ML IJ SUSP
40.0000 mg | INTRAMUSCULAR | Status: AC | PRN
  Administered 2021-05-10: 40 mg via INTRA_ARTICULAR

## 2021-05-10 MED ORDER — BUPIVACAINE HCL 0.5 % IJ SOLN
2.0000 mL | INTRAMUSCULAR | Status: AC | PRN
Start: 2021-05-10 — End: 2021-05-10
  Administered 2021-05-10: 2 mL via INTRA_ARTICULAR

## 2021-05-10 MED ORDER — LIDOCAINE HCL 1 % IJ SOLN
2.0000 mL | INTRAMUSCULAR | Status: AC | PRN
  Administered 2021-05-10: 2 mL

## 2021-05-10 NOTE — Progress Notes (Signed)
Office Visit Note   Patient: [name]           Date of Birth: 19-Mar-1961           MRN: [id number] Visit Date: 05/10/2021              Requested by: Jinny Sanders, MD [place],  [place] [address] PCP: Jinny Sanders, MD   Assessment & Plan: Visit Diagnoses:  1. Primary osteoarthritis of left knee     Plan: Impression is left knee medial compartment osteoarthritis with 1 month of left knee stiffness. I have a low suspicion that the loose body seen on her x-rays is contributing to her knee symptoms. I recommended an intra-articular cortisone injection of her left knee for diagnostic and therapeutic purposes. If this fails to improve her symptoms we'll consider ordering an MRI of her knee to evaluate for a meniscus or cartilaginous injury. Patient was amenable to this plan. We discussed the natural history of osteoarthritis and conservative management including bracing and injections.   Follow-Up Instructions: Call in 2-3 weeks if not feeling any improvement from the injection, at which time we'll order a left knee MRI.   Orders:  Orders Placed This Encounter  Procedures   Large Joint Inj    No orders of the defined types were placed in this encounter.     Procedures: Large Joint Inj: L knee on 05/10/2021 9:23 PM Details: 22 G needle Medications: 2 mL bupivacaine 0.5 %; 2 mL lidocaine 1 %; 40 mg methylPREDNISolone acetate 40 MG/ML Outcome: tolerated well, no immediate complications Patient was prepped and draped in the usual sterile fashion.      Clinical Data: No additional findings.   Subjective: Chief Complaint  Patient presents with   Left Knee - Pain    HPI [name] is a 60 year old female who presents for evaluation of left knee stiffness for 1 month. She denies any precipitating injury. She woke up and was unable to fully extend her knee. She states it feels like something is stuck in her knee. She has some pain over the medial  knee with prolonged walking and endorses occasional swelling. She denies mechanical symptoms. She has not tried bracing, physical therapy or had any injections to her knee. She tried taking Aleve but it was not helpful. No previous injuries or surgeries to the left knee.   Review of Systems Review of Systems was reviewed and negative unless as stated in the HPI.  Objective: Vital Signs: LMP 11/02/2011 (Exact Date)   Physical Exam  Ortho Exam On exam she stands with her left knee in 5-10 degrees of flexion and walks with a limp. Active ROM 10-80 degrees with pain at endpoints.There is crepitus under the patella with passive knee motion. No tenderness over the medial joint line, lateral joint line or patellar facets. Stable with varus and valgus stress at 0 and 30 degrees. Distal neurovascular exam is intact.   Specialty Comments:  No specialty comments available.  Imaging: Radiographs of the left knee dated 04/26/2021 were reviewed during today's visit and showed severe medial compartment degenerative changes. There is a small 3 mm loose body seen on the lateral view that cannot be appreciated on the AP view.   PMFS History: Patient Active Problem List   Diagnosis Date Noted   Loose body in knee, left knee 05/01/2021   Acute pain of left knee 05/01/2021   Patellofemoral syndrome of both knees 08/07/2020  Tingling in extremities 04/18/2015   HTN (hypertension) 11/29/2011   Hyperlipidemia 08/26/2007   Past Medical History:  Diagnosis Date   Endometriosis    Hyperlipidemia    Hypertension     Family History  Problem Relation Age of Onset   Stroke Mother    Heart disease Mother    Diabetes Father    Kidney disease Father    Hypertension Father    Hyperlipidemia Father    Coronary artery disease Father    Hyperlipidemia Brother    Hyperlipidemia Brother    Breast cancer Neg Hx     Past Surgical History:  Procedure Laterality Date   CYSTOSCOPY  11/19/2011   Procedure:  CYSTOSCOPY;  Surgeon: Lubertha South Romine;  Location: [facility];  Service: Gynecology;  Laterality: N/A;   ROBOTIC ASSISTED LAP VAGINAL HYSTERECTOMY Left 11/19/11   vag hyst/LSO   Social History   Occupational History   Occupation: OFFICE MANAGER    Employer: [employer]  Tobacco Use   Smoking status: Never   Smokeless tobacco: Never  Vaping Use   Vaping Use: Never used  Substance and Sexual Activity   Alcohol use: No   Drug use: No   Sexual activity: Not Currently    Birth control/protection: None, Surgical

## 2021-05-23 ENCOUNTER — Other Ambulatory Visit: Payer: Self-pay | Admitting: Obstetrics and Gynecology

## 2021-05-23 ENCOUNTER — Other Ambulatory Visit: Payer: Self-pay | Admitting: Family Medicine

## 2021-05-23 DIAGNOSIS — Z1231 Encounter for screening mammogram for malignant neoplasm of breast: Secondary | ICD-10-CM

## 2021-05-24 ENCOUNTER — Ambulatory Visit (INDEPENDENT_AMBULATORY_CARE_PROVIDER_SITE_OTHER): Payer: No Typology Code available for payment source

## 2021-05-24 ENCOUNTER — Other Ambulatory Visit: Payer: Self-pay

## 2021-05-24 DIAGNOSIS — Z1231 Encounter for screening mammogram for malignant neoplasm of breast: Secondary | ICD-10-CM | POA: Diagnosis not present

## 2021-05-27 NOTE — Progress Notes (Signed)
Kimberlee Shoun T. Elman Dettman, MD, [place] [facility] [place], [address]  Phone: [phone]  FAX: [phone]  [name] - 60 y.o. female  MRN [id number]  Date of Birth: 1961/02/17  Date: 05/28/2021  PCP: Jinny Sanders, MD  Referral: Jinny Sanders, MD  Chief Complaint  Patient presents with   Knee Pain    Left-Seen by ortho 3 weeks ago and given cortisone injection-no improvement    This visit occurred during the SARS-CoV-2 public health emergency.  Safety protocols were in place, including screening questions prior to the visit, additional usage of staff PPE, and extensive cleaning of exam room while observing appropriate contact time as indicated for disinfecting solutions.   Subjective:   [name] is a 60 y.o. very pleasant female patient with Body mass index is 23.29 kg/m. who presents with the following:  She is a pleasant 60 year old, she recently saw my partner Dr. Diona Browner and had follow-up with Dr. Erlinda [facility] at [facility].  At the end of May she was given a round of some oral prednisone. There was a question of a small loose body, and Dr. Erlinda Hong gave her a intra-articular knee injection.  He felt like this was predominantly knee OA, and while the loose body was there, he did not feel like this was likely the major cause of her symptoms.  She wanted to come to the office and talk to me about it.  Knee has been bothering since May.  No injury.  Woke up with some pain. Normally 2 miles or so.  Now she is limping with every step, rising and getting up out of a chair, and going up and down stairs, and additionally even just walking around in her house.   Review of Systems is noted in the HPI, as appropriate   Patient Active Problem List   Diagnosis Date Noted   Loose body in knee, left knee 05/01/2021   Acute pain of left knee 05/01/2021   Patellofemoral syndrome of both knees 08/07/2020   Tingling in  extremities 04/18/2015   HTN (hypertension) 11/29/2011   Hyperlipidemia 08/26/2007    Past Medical History:  Diagnosis Date   Endometriosis    Hyperlipidemia    Hypertension     Past Surgical History:  Procedure Laterality Date   CYSTOSCOPY  11/19/2011   Procedure: CYSTOSCOPY;  Surgeon: Lubertha South Romine;  Location: [facility];  Service: Gynecology;  Laterality: N/A;   ROBOTIC ASSISTED LAP VAGINAL HYSTERECTOMY Left 11/19/11   vag hyst/LSO    Family History  Problem Relation Age of Onset   Stroke Mother    Heart disease Mother    Diabetes Father    Kidney disease Father    Hypertension Father    Hyperlipidemia Father    Coronary artery disease Father    Hyperlipidemia Brother    Hyperlipidemia Brother    Breast cancer Neg Hx      Objective:   BP 114/78   Pulse 76   Temp 98 F (36.7 C) (Temporal)   Ht 5\' 3"  (1.6 m)   Wt 131 lb 8 oz (59.6 kg)   LMP 11/02/2011 (Exact Date)   SpO2 96%   BMI 23.29 kg/m    Knee: Left Gait: Markedly abnormal gait, antalgic ROM: She lacks 10 degrees of extension and flexion to 100 degrees. Effusion: Mild Echymosis or edema: none Patellar tendon NT Painful PLICA: neg Patellar grind: negative Medial and lateral patellar facet  loading: negative medial and lateral joint lines: Medial joint line tenderness Mcmurray's positive for pain Flexion-pinch positive for pain Varus and valgus stress: stable Lachman: neg Ant and Post drawer: neg Hip abduction, IR, ER: WNL Hip flexion str: 5/5 Hip abd: 5/5 Quad: 5/5 VMO atrophy:No Hamstring concentric and eccentric: 5/5   Radiology: CLINICAL DATA:  Left knee pain with extension   EXAM: LEFT KNEE - COMPLETE 4+ VIEW   COMPARISON:  None.   FINDINGS: Alignment is anatomic. No acute fracture. No joint effusion. Degenerative changes are present primarily medially where there is significant joint space loss. Possible small intra-articular loose body seen on the lateral view.    IMPRESSION: Medial compartment osteoarthritis. Possible 3 mm intra-articular loose body on the lateral view.     Electronically Signed   By: Macy Mis M.D.   On: 04/26/2021 09:30    Assessment and Plan:     ICD-10-CM   1. Acute pain of left knee  M25.562 MR Knee Left  Wo Contrast    2. Primary osteoarthritis of left knee  M17.12 MR Knee Left  Wo Contrast    3. Limitation of joint motion of knee, left  M25.662 MR Knee Left  Wo Contrast     Greater than 6 weeks of acute onset left-sided knee pain without injury.  She is limping severely with pain with weightbearing.   She saw Dr. Erlinda Hong, and I completely agree with his assessment and plan.  Failure of oral NSAIDs, Tylenol, oral prednisone, intra-articular injection of Depo-Medrol, and she is still is doing poorly.  Her knee injection provided no relief at all.  Obtain an MRI of the left knee to evaluate: In this case with a 60 year old female, evaluate for medial plateau insufficiency fracture.  Additional pathology such as large or loose body or bucket-handle meniscal tear is also possible.  Exam: Markedly abnormal extension and flexion compared to the contralateral side.  Medial joint tenderness, medial tibial plateau tenderness, pain with any form of deep flexion such as McMurray's and flexion pinch and additionally some pain with bounce home testing.  Social: Decidedly decreased ability to exercise secondary to knee pain.  Orders Placed This Encounter  Procedures   MR Knee Left  Wo Contrast    Follow-up: This will depend upon advanced imaging  Signed,  Hadi Dubin T. Gardner Servantes, MD   Outpatient Encounter Medications as of 05/28/2021  Medication Sig   aspirin 81 MG tablet Take 81 mg by mouth daily.   b complex vitamins capsule Take 1 capsule by mouth daily.   Calcium Carbonate (CALCIUM 600 PO) Take 600 mg by mouth daily.   losartan-hydrochlorothiazide (HYZAAR) 50-12.5 MG tablet Take 1 tablet by mouth daily.   Misc  Natural Products (JOINT HEALTH PO) Take 2 tablets by mouth daily.   Omega 3 1000 MG CAPS Take by mouth daily.   pravastatin (PRAVACHOL) 10 MG tablet Take 1 tablet (10 mg total) by mouth daily.   predniSONE (DELTASONE) 20 MG tablet 2 tabs by mouth daily x 5 days, then 1 tabs by mouth daily x 5 days   TURMERIC PO Take 1,000 mg by mouth daily.   vitamin C (ASCORBIC ACID) 500 MG tablet Take 500 mg by mouth daily.   VITAMIN D PO Take by mouth.   No facility-administered encounter medications on file as of 05/28/2021.

## 2021-05-28 ENCOUNTER — Encounter: Payer: Self-pay | Admitting: Family Medicine

## 2021-05-28 ENCOUNTER — Ambulatory Visit (INDEPENDENT_AMBULATORY_CARE_PROVIDER_SITE_OTHER): Payer: No Typology Code available for payment source | Admitting: Family Medicine

## 2021-05-28 ENCOUNTER — Other Ambulatory Visit: Payer: Self-pay

## 2021-05-28 VITALS — BP 114/78 | HR 76 | Temp 98.0°F | Ht 63.0 in | Wt 131.5 lb

## 2021-05-28 DIAGNOSIS — M25562 Pain in left knee: Secondary | ICD-10-CM

## 2021-05-28 DIAGNOSIS — M25662 Stiffness of left knee, not elsewhere classified: Secondary | ICD-10-CM

## 2021-05-28 DIAGNOSIS — M1712 Unilateral primary osteoarthritis, left knee: Secondary | ICD-10-CM | POA: Diagnosis not present

## 2021-05-30 NOTE — Progress Notes (Signed)
60 y.o. G1P0010 Single (long term partner) Asian Not Hispanic or Latino female here for annual exam.  Patient states that she has been sweating a lot. H/O TLH/LSO in 2012, right tube and ovary were absent.  Developed melasma on HRT. Elevated risk of CVD, not great candidate for HRT. She was previously on gabapentin for vasomotor symptoms, didn't feel it helped.  She is having hot flashes 10-12 x a day. Night sweats aren't as bad. She has having hot flashes in her 20's, got worse with age, worse with hysterectomy. No real change. It's tolerable to her.   Not sexually active. Same long term partner.   No bowel or bladder c/o.   She c/o hair growth, needs to shave her side burn area. That started ~ 6 months ago.     Patient's last menstrual period was 11/02/2011 (exact date).          Sexually active: No.  The current method of family planning is status post hysterectomy.    Exercising: Yes.     Walking  Smoker:  no  Health Maintenance: Pap:  02/28/11  History of abnormal Pap:  no MMG:  05/25/21 Density C Bi-rads 1 neg  BMD:   none  Colonoscopy: 06/22/2002 F/u 10 years  IFOB 06/13/20: negative (f/u with primary) TDaP:  07/31/17 Gardasil: none   reports that she has never smoked. She has never used smokeless tobacco. 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   Past Medical History:  Diagnosis Date   Endometriosis    Hyperlipidemia    Hypertension     Past Surgical History:  Procedure Laterality Date   CYSTOSCOPY  11/19/2011   Procedure: CYSTOSCOPY;  Surgeon: Lubertha South Romine;  Location: [facility];  Service: Gynecology;  Laterality: N/A;   ROBOTIC ASSISTED LAP VAGINAL HYSTERECTOMY Left 11/19/11   vag hyst/LSO    Current Outpatient Medications  Medication Sig Dispense Refill   aspirin 81 MG tablet Take 81 mg by mouth daily.     b complex vitamins capsule Take 1 capsule by mouth daily.     Calcium  Carbonate (CALCIUM 600 PO) Take 600 mg by mouth daily.     losartan-hydrochlorothiazide (HYZAAR) 50-12.5 MG tablet Take 1 tablet by mouth daily. 90 tablet 3   Misc Natural Products (JOINT HEALTH PO) Take 2 tablets by mouth daily.     Omega 3 1000 MG CAPS Take by mouth daily.     pravastatin (PRAVACHOL) 10 MG tablet Take 1 tablet (10 mg total) by mouth daily. 90 tablet 3   TURMERIC PO Take 1,000 mg by mouth daily.     vitamin C (ASCORBIC ACID) 500 MG tablet Take 500 mg by mouth daily.     VITAMIN D PO Take by mouth.     No current facility-administered medications for this visit.    Family History  Problem Relation Age of Onset   Stroke Mother    Heart disease Mother    Diabetes Father    Kidney disease Father    Hypertension Father    Hyperlipidemia Father    Coronary artery disease Father    Hyperlipidemia Brother    Hyperlipidemia Brother    Breast cancer Neg Hx     Review of Systems  All other systems reviewed and are negative.  Exam:   BP 122/78   Pulse 78   Ht 5' 2.5" (1.588 m)   Wt 130  lb (59 kg)   LMP 11/02/2011 (Exact Date)   SpO2 99%   BMI 23.40 kg/m   Weight change: @WEIGHTCHANGE @ Height:   Height: 5' 2.5" (158.8 cm)  Ht Readings from Last 3 Encounters:  05/31/21 5' 2.5" (1.588 m)  05/28/21 5\' 3"  (1.6 m)  04/24/21 5\' 3"  (1.6 m)    General appearance: alert, cooperative and appears stated age Head: Normocephalic, without obvious abnormality, atraumatic Neck: no adenopathy, supple, symmetrical, trachea midline and thyroid normal to inspection and palpation Lungs: clear to auscultation bilaterally Cardiovascular: regular rate and rhythm Breasts: normal appearance, no masses or tenderness Abdomen: soft, non-tender; non distended,  no masses,  no organomegaly Extremities: extremities normal, atraumatic, no cyanosis or edema Skin: Skin color, texture, turgor normal. No rashes or lesions Lymph nodes: Cervical, supraclavicular, and axillary nodes normal. No  abnormal inguinal nodes palpated Neurologic: Grossly normal   Pelvic: External genitalia:  no lesions              Urethra:  normal appearing urethra with no masses, tenderness or lesions              Bartholins and Skenes: normal                 Vagina: normal appearing vagina with normal color and discharge, no lesions              Cervix: absent               Bimanual Exam:  Uterus:  uterus absent              Adnexa: no mass, fullness, tenderness               Rectovaginal: Confirms               Anus:  normal sphincter tone, no lesions  Caryn Bee, CMA chaperoned for the exam.  1. Well woman exam Discussed breast self exam Discussed calcium and vit D intake Mammogram UTD Labs with primary IFOB with primary   2. History of hysterectomy   3. Vasomotor symptoms due to menopause Long term issue, she developed melasma with ERT, gabapentin didn't help. She declines use of Effexor.   4. Hirsutism New onset - 17-Hydroxyprogesterone - Testosterone, Total, LC/MS/MS

## 2021-05-31 ENCOUNTER — Telehealth: Payer: Self-pay | Admitting: Family Medicine

## 2021-05-31 ENCOUNTER — Ambulatory Visit (INDEPENDENT_AMBULATORY_CARE_PROVIDER_SITE_OTHER): Payer: No Typology Code available for payment source | Admitting: Obstetrics and Gynecology

## 2021-05-31 ENCOUNTER — Other Ambulatory Visit: Payer: Self-pay

## 2021-05-31 ENCOUNTER — Encounter: Payer: Self-pay | Admitting: Obstetrics and Gynecology

## 2021-05-31 VITALS — BP 122/78 | HR 78 | Ht 62.5 in | Wt 130.0 lb

## 2021-05-31 DIAGNOSIS — N951 Menopausal and female climacteric states: Secondary | ICD-10-CM

## 2021-05-31 DIAGNOSIS — Z90710 Acquired absence of both cervix and uterus: Secondary | ICD-10-CM | POA: Diagnosis not present

## 2021-05-31 DIAGNOSIS — L680 Hirsutism: Secondary | ICD-10-CM

## 2021-05-31 DIAGNOSIS — Z01419 Encounter for gynecological examination (general) (routine) without abnormal findings: Secondary | ICD-10-CM | POA: Diagnosis not present

## 2021-05-31 NOTE — Patient Instructions (Signed)

## 2021-05-31 NOTE — Telephone Encounter (Signed)
Mrs. [name] called in and stated that she was told by her insurance to have her MRI at [facility] and the fax number is [phone], and she stated that they would call her to schedule.

## 2021-05-31 NOTE — Telephone Encounter (Addendum)
Pt requesting order be faxed to [facility] [place] Fax # [phone] Phone # [phone]  [facility] to verify and they are a company that offers lower self pay rates since patients with this insurance have to pay out of pocket tor scans.  They are contracted with [facility]  They are aware that we have already sent the order over to [facility] - they stated that that is fine but they have to schedule the patient, not [facility], so that the patient can get the lower rate.   Order printed and faxed over to [employer] Fax # [phone]  They are going to call and schedule the patient asap

## 2021-06-03 LAB — TESTOSTERONE, TOTAL, LC/MS/MS: Testosterone, Total, LC-MS-MS: 10 ng/dL (ref 2–45)

## 2021-06-03 LAB — 17-HYDROXYPROGESTERONE: 17-OH-Progesterone, LC/MS/MS: 14 ng/dL

## 2021-06-07 ENCOUNTER — Other Ambulatory Visit: Payer: Self-pay | Admitting: Family Medicine

## 2021-06-07 NOTE — Telephone Encounter (Signed)
Called [facility] and patient is scheduled for 06/18/21 at 10:30 am

## 2021-06-19 ENCOUNTER — Telehealth: Payer: Self-pay | Admitting: Family Medicine

## 2021-06-19 ENCOUNTER — Other Ambulatory Visit: Payer: Self-pay

## 2021-06-19 ENCOUNTER — Other Ambulatory Visit (INDEPENDENT_AMBULATORY_CARE_PROVIDER_SITE_OTHER): Payer: No Typology Code available for payment source

## 2021-06-19 DIAGNOSIS — E782 Mixed hyperlipidemia: Secondary | ICD-10-CM

## 2021-06-19 LAB — COMPREHENSIVE METABOLIC PANEL
ALT: 14 U/L (ref 0–35)
AST: 16 U/L (ref 0–37)
Albumin: 4.6 g/dL (ref 3.5–5.2)
Alkaline Phosphatase: 55 U/L (ref 39–117)
BUN: 12 mg/dL (ref 6–23)
CO2: 31 mEq/L (ref 19–32)
Calcium: 9.7 mg/dL (ref 8.4–10.5)
Chloride: 104 mEq/L (ref 96–112)
Creatinine, Ser: 0.73 mg/dL (ref 0.40–1.20)
GFR: 90.13 mL/min (ref >60.00)
Glucose, Bld: 96 mg/dL (ref 70–99)
Potassium: 4 mEq/L (ref 3.5–5.1)
Sodium: 142 mEq/L (ref 135–145)
Total Bilirubin: 0.9 mg/dL (ref 0.2–1.2)
Total Protein: 7 g/dL (ref 6.0–8.3)

## 2021-06-19 LAB — LIPID PANEL
Cholesterol: 230 mg/dL — ABNORMAL HIGH (ref 0–200)
HDL: 47.3 mg/dL (ref >39.00)
NonHDL: 182.8
Total CHOL/HDL Ratio: 5
Triglycerides: 325 mg/dL — ABNORMAL HIGH (ref 0.0–149.0)
VLDL: 65 mg/dL — ABNORMAL HIGH (ref 0.0–40.0)

## 2021-06-19 LAB — LDL CHOLESTEROL, DIRECT: Direct LDL: 127 mg/dL

## 2021-06-19 NOTE — Telephone Encounter (Signed)
labs

## 2021-06-19 NOTE — Progress Notes (Signed)
No critical labs need to be addressed urgently. We will discuss labs in detail at upcoming office visit.   

## 2021-06-21 ENCOUNTER — Encounter: Payer: Self-pay | Admitting: Family Medicine

## 2021-06-21 ENCOUNTER — Ambulatory Visit (INDEPENDENT_AMBULATORY_CARE_PROVIDER_SITE_OTHER): Payer: No Typology Code available for payment source | Admitting: Family Medicine

## 2021-06-21 ENCOUNTER — Other Ambulatory Visit: Payer: Self-pay

## 2021-06-21 VITALS — BP 120/82 | HR 73 | Temp 97.4°F | Ht 62.5 in | Wt 130.2 lb

## 2021-06-21 DIAGNOSIS — R232 Flushing: Secondary | ICD-10-CM

## 2021-06-21 DIAGNOSIS — Z0000 Encounter for general adult medical examination without abnormal findings: Secondary | ICD-10-CM

## 2021-06-21 DIAGNOSIS — I10 Essential (primary) hypertension: Secondary | ICD-10-CM

## 2021-06-21 DIAGNOSIS — Z23 Encounter for immunization: Secondary | ICD-10-CM

## 2021-06-21 DIAGNOSIS — Z1211 Encounter for screening for malignant neoplasm of colon: Secondary | ICD-10-CM

## 2021-06-21 DIAGNOSIS — E782 Mixed hyperlipidemia: Secondary | ICD-10-CM

## 2021-06-21 LAB — TSH: TSH: 2.07 u[IU]/mL (ref 0.35–5.50)

## 2021-06-21 LAB — T3, FREE: T3, Free: 3.6 pg/mL (ref 2.3–4.2)

## 2021-06-21 LAB — T4, FREE: Free T4: 0.71 ng/dL (ref 0.60–1.60)

## 2021-06-21 NOTE — Patient Instructions (Addendum)
Please stop at the lab to have labs drawn.  Consider switching to B12 as opposed to B complex with thiamine.  Pick up stool cards at lab on way out.  Keep up heart healthy diet, do 3-5 days a week walking 20-30 min.

## 2021-06-21 NOTE — Progress Notes (Signed)
Patient ID: [name], female    DOB: 10/02/1961, 60 y.o.   MRN: [id number]  This visit was conducted in person.  BP 120/82   Pulse 73   Temp (!) 97.4 F (36.3 C) (Temporal)   Ht 5' 2.5" (1.588 m)   Wt 130 lb 4 oz (59.1 kg)   LMP 11/02/2011 (Exact Date)   SpO2 98%   BMI 23.44 kg/m    CC:  Chief Complaint  Patient presents with   Annual Exam    Subjective:   HPI: [name] is a 60 y.o. female presenting on 06/21/2021 for Annual Exam  Hypertension:   At goal on losartan HCTZ 50/12.5 mg daily BP Readings from Last 3 Encounters:  06/21/21 120/82  05/31/21 122/78  05/28/21 114/78  using medication without problems or lightheadedness:  none Chest pain with exertion: none Edema: none Short of breath: none Average home BPs: 120/80 Other issues:  She has noted trouble sleeping, sweating a lot and feeling nervous around people. Ongoing for 2 years, worse this year. [place] Visit from 06/21/2021 in [place] at [facility] Total Score 0        Elevated Cholesterol:    tolerable pravastatin 10 mg daily Lab Results  Component Value Date   CHOL 230 (H) 06/19/2021   HDL 47.30 06/19/2021   LDLCALC 122 (H) 06/01/2020   LDLDIRECT 127.0 06/19/2021   TRIG 325.0 (H) 06/19/2021   CHOLHDL 5 06/19/2021  The 10-year ASCVD risk score [name] DC Jr., et al., 2013) is: 4.4%   Values used to calculate the score:     Age: 57 years     Sex: Female     Is Non-Hispanic African American: No     Diabetic: No     Tobacco smoker: No     Systolic Blood Pressure: 774 mmHg     Is BP treated: Yes     HDL Cholesterol: 47.3 mg/dL     Total Cholesterol: 230 mg/dL Using medications without problems: Muscle aches:  Diet compliance: low fat, low carb Exercise: walking daily Other complaints:  Knee pain: Still with pain despite steroid injections.     Relevant past medical, surgical, family and social history reviewed and updated as indicated.  Interim medical history since our last visit reviewed. Allergies and medications reviewed and updated. Outpatient Medications Prior to Visit  Medication Sig Dispense Refill   aspirin 81 MG tablet Take 81 mg by mouth daily.     b complex vitamins capsule Take 1 capsule by mouth daily.     Calcium Carbonate (CALCIUM 600 PO) Take 600 mg by mouth daily.     losartan-hydrochlorothiazide (HYZAAR) 50-12.5 MG tablet Take 1 tablet by mouth once daily 90 tablet 0   Misc Natural Products (JOINT HEALTH PO) Take 2 tablets by mouth daily.     Omega 3 1000 MG CAPS Take by mouth daily.     pravastatin (PRAVACHOL) 10 MG tablet Take 1 tablet by mouth once daily 90 tablet 0   TURMERIC PO Take 1,000 mg by mouth daily.     vitamin C (ASCORBIC ACID) 500 MG tablet Take 500 mg by mouth daily.     VITAMIN D PO Take by mouth.     No facility-administered medications prior to visit.     Per HPI unless specifically indicated in ROS section below Review of Systems  Constitutional:  Positive for diaphoresis. Negative for fatigue and fever.  HENT:  Negative for congestion.  Eyes:  Negative for pain.  Respiratory:  Negative for cough, shortness of breath and wheezing.   Cardiovascular:  Negative for chest pain, palpitations and leg swelling.  Gastrointestinal:  Negative for abdominal pain.  Genitourinary:  Negative for dysuria and vaginal bleeding.  Musculoskeletal:  Negative for back pain.  Neurological:  Negative for syncope, light-headedness and headaches.  Psychiatric/Behavioral:  Negative for dysphoric mood.   Objective:  BP 120/82   Pulse 73   Temp (!) 97.4 F (36.3 C) (Temporal)   Ht 5' 2.5" (1.588 m)   Wt 130 lb 4 oz (59.1 kg)   LMP 11/02/2011 (Exact Date)   SpO2 98%   BMI 23.44 kg/m   Wt Readings from Last 3 Encounters:  06/21/21 130 lb 4 oz (59.1 kg)  05/31/21 130 lb (59 kg)  05/28/21 131 lb 8 oz (59.6 kg)      Physical Exam Constitutional:      General: She is not in acute distress.     Appearance: Normal appearance. She is well-developed. She is not ill-appearing or toxic-appearing.  HENT:     Head: Normocephalic.     Right Ear: Hearing, tympanic membrane, ear canal and external ear normal.     Left Ear: Hearing, tympanic membrane, ear canal and external ear normal.     Nose: Nose normal.  Eyes:     General: Lids are normal. Lids are everted, no foreign bodies appreciated.     Conjunctiva/sclera: Conjunctivae normal.     Pupils: Pupils are equal, round, and reactive to light.  Neck:     Thyroid: No thyroid mass or thyromegaly.     Vascular: No carotid bruit.     Trachea: Trachea normal.  Cardiovascular:     Rate and Rhythm: Normal rate and regular rhythm.     Heart sounds: Normal heart sounds, S1 normal and S2 normal. No murmur heard.   No gallop.  Pulmonary:     Effort: Pulmonary effort is normal. No respiratory distress.     Breath sounds: Normal breath sounds. No wheezing, rhonchi or rales.  Abdominal:     General: Bowel sounds are normal. There is no distension or abdominal bruit.     Palpations: Abdomen is soft. There is no fluid wave or mass.     Tenderness: There is no abdominal tenderness. There is no guarding or rebound.     Hernia: No hernia is present.  Musculoskeletal:     Cervical back: Normal range of motion and neck supple.  Lymphadenopathy:     Cervical: No cervical adenopathy.  Skin:    General: Skin is warm and dry.     Findings: No rash.  Neurological:     Mental Status: She is alert.     Cranial Nerves: No cranial nerve deficit.     Sensory: No sensory deficit.  Psychiatric:        Mood and Affect: Mood is not anxious or depressed.        Speech: Speech normal.        Behavior: Behavior normal. Behavior is cooperative.        Judgment: Judgment normal.      Results for orders placed or performed in visit on 06/19/21  Comprehensive metabolic panel  Result Value Ref Range   Sodium 142 135 - 145 mEq/L   Potassium 4.0 3.5 - 5.1 mEq/L    Chloride 104 96 - 112 mEq/L   CO2 31 19 - 32 mEq/L   Glucose, Bld 96 70 -  99 mg/dL   BUN 12 6 - 23 mg/dL   Creatinine, Ser 0.73 0.40 - 1.20 mg/dL   Total Bilirubin 0.9 0.2 - 1.2 mg/dL   Alkaline Phosphatase 55 39 - 117 U/L   AST 16 0 - 37 U/L   ALT 14 0 - 35 U/L   Total Protein 7.0 6.0 - 8.3 g/dL   Albumin 4.6 3.5 - 5.2 g/dL   GFR 90.13 >60.00 mL/min   Calcium 9.7 8.4 - 10.5 mg/dL  Lipid panel  Result Value Ref Range   Cholesterol 230 (H) 0 - 200 mg/dL   Triglycerides 325.0 (H) 0.0 - 149.0 mg/dL   HDL 47.30 >39.00 mg/dL   VLDL 65.0 (H) 0.0 - 40.0 mg/dL   Total CHOL/HDL Ratio 5    NonHDL 182.80   LDL cholesterol, direct  Result Value Ref Range   Direct LDL 127.0 mg/dL    This visit occurred during the SARS-CoV-2 public health emergency.  Safety protocols were in place, including screening questions prior to the visit, additional usage of staff PPE, and extensive cleaning of exam room while observing appropriate contact time as indicated for disinfecting solutions.   COVID 19 screen:  No recent travel or known exposure to COVID19 The patient denies respiratory symptoms of COVID 19 at this time. The importance of social distancing was discussed today.   Assessment and Plan The patient's preventative maintenance and recommended screening tests for an annual wellness exam were reviewed in full today. Brought up to date unless services declined.  Counselled on the importance of diet, exercise, and its role in overall health and mortality. The patient's FH and SH was reviewed, including their home life, tobacco status, and drug and alcohol status.    Vaccines:  uptodate with TD, COVID19 x 3, given shingrix. Colon: 2003 Dr. Olevia Perches, nml, recommend repeat in 10 years.. Given she cannot do this now we will do IFOB yearly... neg 06/2020 Mammo:05/2021 Pap/DVE:  TAH for noncancer 2012 per GYN  6/22 GYN exam  Refused HIV testing.  Problem List Items Addressed This Visit     Flushing     Associated with sweating outside, nervousness... eval thyroid.. may be GAD.       Relevant Orders   TSH   T4, free   T3, free   HTN (hypertension)    Stable, chronic.  Continue current medication.   Losartan HCTZ 50/12.5 mg daily       Hyperlipidemia    Stable, chronic.  Continue current medication.   Pravastatin 10 mg daily       Other Visit Diagnoses     Routine general medical examination at a health care facility    -  Primary   Colon cancer screening       Relevant Orders   Fecal occult blood, imunochemical        Eliezer Lofts, MD

## 2021-06-21 NOTE — Assessment & Plan Note (Signed)
Stable, chronic.  Continue current medication.   Losartan HCTZ 50/12.5 mg daily

## 2021-06-21 NOTE — Addendum Note (Signed)
Addended by: Carter Kitten on: 06/21/2021 09:51 AM   Modules accepted: Orders

## 2021-06-21 NOTE — Assessment & Plan Note (Signed)
Associated with sweating outside, nervousness... eval thyroid.. may be GAD.

## 2021-06-21 NOTE — Assessment & Plan Note (Signed)
Stable, chronic.  Continue current medication.   Pravastatin 10 mg daily

## 2021-07-01 NOTE — Telephone Encounter (Signed)
Can you guys work on getting her approved or find her preferred product for viscosupplementation?  If it helps, she just had an MRI, so that would be a big chunk or maybe all of her deductible.

## 2021-07-02 NOTE — Telephone Encounter (Signed)
Benefits submitted on [email or web address]. Waiting for benefit response

## 2021-07-06 ENCOUNTER — Other Ambulatory Visit (INDEPENDENT_AMBULATORY_CARE_PROVIDER_SITE_OTHER): Payer: No Typology Code available for payment source

## 2021-07-06 DIAGNOSIS — Z1211 Encounter for screening for malignant neoplasm of colon: Secondary | ICD-10-CM

## 2021-07-06 LAB — FECAL OCCULT BLOOD, IMMUNOCHEMICAL: Fecal Occult Bld: NEGATIVE

## 2021-07-08 NOTE — Telephone Encounter (Signed)
[employer] website requested on Fri, 07/06/21, copy of front and back of pts insurance card.  This was faxed on 07/06/21. Portal is reporting the fax was received.

## 2021-07-24 NOTE — Telephone Encounter (Signed)
Pt called to follow up on injection request

## 2021-07-24 NOTE — Telephone Encounter (Signed)
Sent patient a my chart message explaining the delay and that we should have clear benefit coverage and next steps very soon to review with patient.  Waiting for CMA, Josepha Pigg, to review benefits and communicate with coverage and next step plans.    Faxed benefit coverage information to [place] today, should hear back soon.   Will call patient as soon as we receive.

## 2021-07-30 NOTE — Telephone Encounter (Signed)
[name],   Thank you for letting me know that her insurance does not cover any form of the visco supplementation.  Sounds like you are investigating other options.    Should we proceed with benefits review for the durolane or gelsyn products or are you saying that her insurance will not cover any of these products?    Just wanted to clarify next steps and options.   Thanks, let me know your recommendations.

## 2021-08-08 NOTE — Telephone Encounter (Signed)
Brandy,  Are there any updates on alternative products for patient?  Durolane or Gelsyn?    She is anxious to hear as this has been going on for a month.  Let me know or if you could reach out to patient to discuss that would be great.   Thanks so much for your help.

## 2021-08-16 NOTE — Telephone Encounter (Signed)
Dr. Lorelei Pont,  [place] (our [place] support with [employer] benefit investigation), reported the following regarding this patients options (or lack thereof):  Hi [name],  I spoke with her insurance company and they advised that they will only cover the administration of an office administered injection. Rep confirmed that no injectable medications will be covered, including [employer] Supplementation.   Theadora Rama, CMA

## 2021-08-23 ENCOUNTER — Other Ambulatory Visit: Payer: Self-pay

## 2021-08-23 ENCOUNTER — Ambulatory Visit (INDEPENDENT_AMBULATORY_CARE_PROVIDER_SITE_OTHER): Payer: No Typology Code available for payment source

## 2021-08-23 ENCOUNTER — Telehealth: Payer: Self-pay | Admitting: Family Medicine

## 2021-08-23 DIAGNOSIS — Z23 Encounter for immunization: Secondary | ICD-10-CM | POA: Diagnosis not present

## 2021-08-23 NOTE — Telephone Encounter (Signed)
Pt dropped off a cd   Type of forms received:cd   Routed OT:RRNHA  Paperwork received by : terrill   Individual made aware of 3-5 business day turn around (Y/N):y  Form completed and patient made aware of charges(Y/N):y   Faxed to :   Form location:  box

## 2021-08-23 NOTE — Telephone Encounter (Signed)
She just wanted me to look at her films and the formal report.

## 2021-09-08 ENCOUNTER — Other Ambulatory Visit: Payer: Self-pay | Admitting: Family Medicine

## 2022-02-06 ENCOUNTER — Encounter: Payer: Self-pay | Admitting: Family Medicine

## 2022-02-06 ENCOUNTER — Other Ambulatory Visit: Payer: Self-pay

## 2022-02-06 ENCOUNTER — Ambulatory Visit (INDEPENDENT_AMBULATORY_CARE_PROVIDER_SITE_OTHER): Payer: Managed Care, Other (non HMO) | Admitting: Family Medicine

## 2022-02-06 VITALS — BP 104/72 | HR 86 | Temp 98.3°F | Ht 62.5 in | Wt 132.3 lb

## 2022-02-06 DIAGNOSIS — M1712 Unilateral primary osteoarthritis, left knee: Secondary | ICD-10-CM

## 2022-02-06 NOTE — Patient Instructions (Signed)
We will check on the viscosupplementation for you.  (Hyaluronic acid) ? ?If your knee is bothering you. ? ?Take Tylenol/Acetaminophen ES ('500mg'$ ) 2 tabs by mouth three times a day max as needed.  ? ?Alleve 2 tabs by mouth two times a day over the counter:  ?(GENERIC CHEAPER EQUIVALENT IS NAPROXEN SODIUM)  ? ?

## 2022-02-06 NOTE — Progress Notes (Signed)
? ? ?Dominque Marlin T. Avianna Moynahan, MD, [facility] ?Therapist, music at [facility] ?[address] ?[place], [address] ? ?Phone: [phone]  FAX: [phone] ? ?[name] - 61 y.o. female  MRN [id number]  Date of Birth: 02/12/1961 ? ?Date: 02/06/2022  PCP: Jinny Sanders, MD  Referral: Jinny Sanders, MD ? ?Chief Complaint  ?Patient presents with  ? Knee Pain  ?  Left  ? ? ?This visit occurred during the SARS-CoV-2 public health emergency.  Safety protocols were in place, including screening questions prior to the visit, additional usage of staff PPE, and extensive cleaning of exam room while observing appropriate contact time as indicated for disinfecting solutions.  ? ?Subjective:  ? ?[name] is a 61 y.o. very pleasant female patient with Body mass index is 23.81 kg/m?. who presents with the following: ? ?F/u L knee pain and OA. ? ?Dr. Van Clines going up and down stairs.  saw her in the past, and he thought that the issue was primarily OA, and the MRI that she had in 06/2021 confirmed some severe medial OA.  He did do a prior knee injection, too. ? ?With walking and has a lot of land.  Flat is ok, but not with unsteady.  Heels will hurt.  Has to go down stairs backwards.  ? ?I her knee is bothering her a lot of the time.  She is not currently having an effusion.  She is not having any mechanical catching or symptomatic giving way. ? ?She does sometimes take Aleve when it is bothering her, and this does seem to help. ?She also does take curcumin. ? ?Feels ok just sitting.  Pillow at night will help. ? ?No daily nsaids or apap. ? ?Check on viscosupplementation ? ?Review of Systems is noted in the HPI, as appropriate ? ?Objective:  ? ?BP 104/72   Pulse 86   Temp 98.3 ?F (36.8 ?C) (Oral)   Ht 5' 2.5" (1.588 m)   Wt 132 lb 5 oz (60 kg)   LMP 11/02/2011 (Exact Date)   SpO2 99%   BMI 23.81 kg/m?  ? ?GEN: No acute distress; alert,appropriate. ?PULM: Breathing comfortably in no  respiratory distress ?PSYCH: Normally interactive.  ? ?Left knee: No effusion.  She does lacks 3 degrees of extension and flexion to roughly 102 degrees.  Stable to varus and valgus stress.  No pain with loading the facets.  She does have notable medial joint line tenderness.  ACL, MCL, PCL, and LCL are all intact.  Any forced flexion causes pain. ? ?Laboratory and Imaging Data: ?I did independently review her x-rays again today, and they do show some severe medial compartment osteoarthritic changes. ? ?Also looked at the MRI report which essentially says the same thing.  There is a complex medial meniscal tear as well. ? ?Assessment and Plan:  ? ?  ICD-10-CM   ?1. Primary osteoarthritis of left knee  M17.12   ?  ? ?Chronic left-sided osteoarthritis severe in the medial compartment.  It is bothering her when she is walking and particularly on uneven surfaces.  When she is sitting or resting it really does not bother her at all. ? ?She had a prior corticosteroid injection that did not provide any significant relief.  She is interested, I think it is also a good idea at this point to try viscosupplementation.  She has no interest in surgery at this point. ? ?Unicompartmental knee could be an option if her symptoms persist and she  would like to pursue this in any way. ? ?We can check about viscosupplementation, then have her follow-up. ? ?Patient Instructions  ?We will check on the viscosupplementation for you.  (Hyaluronic acid) ? ?If your knee is bothering you. ? ?Take Tylenol/Acetaminophen ES ('500mg'$ ) 2 tabs by mouth three times a day max as needed.  ? ?Alleve 2 tabs by mouth two times a day over the counter:  ?(GENERIC CHEAPER EQUIVALENT IS NAPROXEN SODIUM)  ?  ? ?No orders of the defined types were placed in this encounter. ? ?Medications Discontinued During This Encounter  ?Medication Reason  ? aspirin 81 MG tablet Completed Course  ? ?No orders of the defined types were placed in this encounter. ? ? ?Follow-up:  No follow-ups on file. ? ?Dragon Medical One speech-to-text software was used for transcription in this dictation.  Possible transcriptional errors can occur using Editor, commissioning.  ? ?Signed, ? ?Ibrahem Volkman T. Synda Bagent, MD ? ? ?Outpatient Encounter Medications as of 02/06/2022  ?Medication Sig  ? b complex vitamins capsule Take 1 capsule by mouth daily.  ? Calcium Carbonate (CALCIUM 600 PO) Take 600 mg by mouth daily.  ? losartan-hydrochlorothiazide (HYZAAR) 50-12.5 MG tablet Take 1 tablet by mouth once daily  ? Misc Natural Products (JOINT HEALTH PO) Take 2 tablets by mouth daily.  ? Omega 3 1000 MG CAPS Take by mouth daily.  ? pravastatin (PRAVACHOL) 10 MG tablet Take 1 tablet by mouth once daily  ? TURMERIC PO Take 1,000 mg by mouth daily.  ? vitamin C (ASCORBIC ACID) 500 MG tablet Take 500 mg by mouth daily.  ? VITAMIN D PO Take by mouth.  ? [DISCONTINUED] aspirin 81 MG tablet Take 81 mg by mouth daily.  ? ?No facility-administered encounter medications on file as of 02/06/2022.  ?  ?

## 2022-02-14 NOTE — Progress Notes (Signed)
Ria Comment, thank-you so very much for your help. ? ?Butch Penny, can you let her know that her insurance requires a prior steroid injection within the last 6 weeks to approve the hyaluronic acid injection for her knee.  I know that does not make sense.  I am happy to see her and repeat her steroid knee injection if she would like and then complete her prior auth for the other knee injections. ?

## 2022-02-20 ENCOUNTER — Telehealth: Payer: Self-pay | Admitting: *Deleted

## 2022-02-20 NOTE — Progress Notes (Signed)
Ms. Stauffer notified as instructed by telephone.  Appointment scheduled 02/27/2022 at 9:20 am with Dr. Lorelei Pont.  ?

## 2022-02-20 NOTE — Telephone Encounter (Signed)
-----   Message from Owens Loffler, MD sent at 02/14/2022  9:24 AM EDT ----- ? ? ? ?----- Message ----- ?From: Alphonsus Sias, CMA ?Sent: 02/14/2022   9:15 AM EDT ?To: Owens Loffler, MD, Diamond Nickel, RN ? ?This pt required a prior auth & has been denied since she has not had an intraarticular steroid injection in the last 6 weeks. Let me know when this has been completed & i'll run her again.  ? ?Thanks,  ?Ria Comment  ?----- Message ----- ?From: Owens Loffler, MD ?Sent: 02/10/2022   2:25 PM EDT ?To: Alphonsus Sias, CMA, Diamond Nickel, RN ? ?Ria Comment, can you check on Visco for her? ? ?

## 2022-02-20 NOTE — Telephone Encounter (Signed)
[name] notified as instructed by telephone.  Appointment scheduled with Dr. Lorelei Pont 02/27/22 at 9:20 am.   ?

## 2022-02-27 ENCOUNTER — Ambulatory Visit: Payer: No Typology Code available for payment source | Admitting: Family Medicine

## 2022-03-04 ENCOUNTER — Encounter: Payer: Self-pay | Admitting: Family Medicine

## 2022-03-04 ENCOUNTER — Ambulatory Visit (INDEPENDENT_AMBULATORY_CARE_PROVIDER_SITE_OTHER): Payer: Managed Care, Other (non HMO) | Admitting: Family Medicine

## 2022-03-04 VITALS — BP 130/90 | HR 79 | Temp 99.0°F | Ht 62.5 in | Wt 133.1 lb

## 2022-03-04 DIAGNOSIS — G8929 Other chronic pain: Secondary | ICD-10-CM

## 2022-03-04 DIAGNOSIS — M1712 Unilateral primary osteoarthritis, left knee: Secondary | ICD-10-CM | POA: Diagnosis not present

## 2022-03-04 DIAGNOSIS — M25562 Pain in left knee: Secondary | ICD-10-CM | POA: Diagnosis not present

## 2022-03-04 MED ORDER — TRIAMCINOLONE ACETONIDE 40 MG/ML IJ SUSP
40.0000 mg | Freq: Once | INTRAMUSCULAR | Status: AC
  Administered 2022-03-04: 40 mg via INTRA_ARTICULAR

## 2022-03-04 NOTE — Addendum Note (Signed)
Addended by: Carter Kitten on: 03/04/2022 12:58 PM ? ? Modules accepted: Orders ? ?

## 2022-03-04 NOTE — Progress Notes (Signed)
? ? ?  Merrell Rettinger T. Arial Galligan, MD, [facility] ?Therapist, music at [facility] ?[address] ?[place], [address] ? ?Phone: [phone]  FAX: [phone] ? ?[name] - 61 y.o. female  MRN [id number]  Date of Birth: [birth year] ? ?Date: 03/04/2022  PCP: Jinny Sanders, MD  Referral: Jinny Sanders, MD ? ?Chief Complaint  ?Patient presents with  ? Knee Pain  ?  Left Knee Injection  ? ? ?This visit occurred during the SARS-CoV-2 public health emergency.  Safety protocols were in place, including screening questions prior to the visit, additional usage of staff PPE, and extensive cleaning of exam room while observing appropriate contact time as indicated for disinfecting solutions.  ? ?Subjective:  ? ?[name] is a 61 y.o. very pleasant female patient with Body mass index is 23.95 kg/m?. who presents with the following: ? ?She has a follow-up chronic left knee pain with notable predominantly medial compartmental osteoarthritis. ? ?Proc only ? ?Aspiration/Injection Procedure Note ?[name] ?[birth year] ?Date of procedure: 03/04/2022 ? ?Procedure: Large Joint Aspiration / Injection of Knee, L ?Indications: Pain ? ?Procedure Details ?Patient verbally consented to procedure. Risks, benefits, and alternatives explained. Sterilely prepped with Chloraprep. Ethyl cholride used for anesthesia. 9 cc Lidocaine 1% mixed with 1 mL of Kenalog 40 mg injected using the anteromedial approach without difficulty. No complications with procedure and tolerated well. Patient had decreased pain post-injection. ?Medication: 1 mL of Kenalog 40 mg  ? ?  ICD-10-CM   ?1. Chronic pain of left knee  M25.562   ? G89.29   ?  ?2. Primary osteoarthritis of left knee  M17.12   ?  ? ?

## 2022-03-07 ENCOUNTER — Other Ambulatory Visit: Payer: Self-pay | Admitting: Family Medicine

## 2022-04-17 ENCOUNTER — Encounter: Payer: Self-pay | Admitting: Family Medicine

## 2022-04-17 ENCOUNTER — Ambulatory Visit (INDEPENDENT_AMBULATORY_CARE_PROVIDER_SITE_OTHER): Payer: Commercial Managed Care - HMO | Admitting: Family Medicine

## 2022-04-17 VITALS — BP 104/70 | HR 83 | Temp 98.2°F | Ht 62.5 in | Wt 133.6 lb

## 2022-04-17 DIAGNOSIS — M1712 Unilateral primary osteoarthritis, left knee: Secondary | ICD-10-CM | POA: Diagnosis not present

## 2022-04-17 DIAGNOSIS — R5383 Other fatigue: Secondary | ICD-10-CM

## 2022-04-17 NOTE — Progress Notes (Signed)
? ? ?[name] T. Faith Stovall, MD, [name] ?Therapist, music at [facility] ?[name] ?[name], [address] ? ?Phone: [phone]  FAX: [phone] ? ?[name] - 61 y.o. female  MRN [id number]  Date of Birth: [birth year] ? ?Date: 04/17/2022  PCP: Jinny Sanders, MD  Referral: Jinny Sanders, MD ? ?Chief Complaint  ?Patient presents with  ? Knee Pain  ?  Left  ? Fatigue  ?  Would like to have B12 level checked  ? ?Subjective:  ? ?[name] is a 61 y.o. very pleasant female patient with Body mass index is 24.04 kg/m?. who presents with the following: ? ?She is here to follow-up regarding her left knee and osteoarthritis, and additionally she is feeling some generalized fatigue. ? ?She has had multiple labs done and her last physical.  She did want to get her B12 checked, it is was low normal last time it was checked. ? ?She went to the beach last week, now her knee is hurting.  Her response to corticosteroids was roughly 3 weeks postinjection, and then she started develop knee pain again.  Most recent knee injection was 03/04/2022. ? ?Extensive work was done to try to get her approved for viscosupplementation injection, and this was very challenging and her insurance required multiple steps and recommended prior corticosteroid injection prior to approval of product. ? ?Review of Systems is noted in the HPI, as appropriate ? ?Patient Active Problem List  ? Diagnosis Date Noted  ? [place] 06/21/2021  ? Loose body in knee, left knee 05/01/2021  ? Acute pain of left knee 05/01/2021  ? 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 Tingling in extremities 04/18/2015  ? HTN (hypertension) 11/29/2011  ? Hyperlipidemia 08/26/2007  ? ? ?Past Medical History:  ?Diagnosis Date  ? Endometriosis   ? Hyperlipidemia   ? Hypertension   ? ? ?Past Surgical History:  ?Procedure Laterality Date  ? CYSTOSCOPY  11/19/2011  ? Procedure: CYSTOSCOPY;  Surgeon: Lubertha South Romine;  Location: [facility];   Service: Gynecology;  Laterality: N/A;  ? ROBOTIC ASSISTED LAP VAGINAL HYSTERECTOMY Left 11/19/11  ? vag hyst/LSO  ? ? ?Family History  ?Problem Relation Age of Onset  ? Stroke Mother   ? Heart disease Mother   ? Diabetes Father   ? Kidney disease Father   ? Hypertension Father   ? Hyperlipidemia Father   ? Coronary artery disease Father   ? Hyperlipidemia Brother   ? Hyperlipidemia Brother   ? Breast cancer Neg Hx   ? ? ?Social History  ? ?Social History Narrative  ? Regular exercise--yes- yard work  ?   ? Diet: fruits and veggies and oatmeal  ?   ? Longterm relationship x 20 years  ?  ? ?Objective:  ? ?BP 104/70   Pulse 83   Temp 98.2 ?F (36.8 ?C) (Oral)   Ht 5' 2.5" (1.588 m)   Wt 133 lb 9 oz (60.6 kg)   LMP 11/02/2011 (Exact Date)   SpO2 99%   BMI 24.04 kg/m?  ? ?GEN: No acute distress; alert,appropriate. ?PULM: Breathing comfortably in no respiratory distress ?PSYCH: Normally interactive.  ? ?She does walk with antalgia. ? ?Left knee lacks 5 degrees of extension and flexion to 110. ?No significant effusion. ?She does have some medial joint line tenderness, minimal on the lateral joint line. ?There is also some patellar crepitus. ? ?Stable to varus and valgus stress. ?Lachman and posterior drawer testing are negative. ? ?  Remainder of the bony anatomy is nontender and patellar and quad tendon are are without tenderness ? ?Laboratory and Imaging Data: ? ?CLINICAL DATA:  Left knee pain with extension ?  ?EXAM: ?LEFT KNEE - COMPLETE 4+ VIEW ?  ?COMPARISON:  None. ?  ?FINDINGS: ?Alignment is anatomic. No acute fracture. No joint effusion. ?Degenerative changes are present primarily medially where there is ?significant joint space loss. Possible small intra-articular loose ?body seen on the lateral view. ?  ?IMPRESSION: ?Medial compartment osteoarthritis. Possible 3 mm intra-articular ?loose body on the lateral view. ?  ?  ?Electronically Signed ?  By: Macy Mis M.D. ?  On: 04/26/2021 09:30 ?   ? ?Margarette Asal, DO - 06/18/2021  ?Formatting of this note might be different from the original.  ?COMPARISON:  None.    ?INDICATION: Pain in left knee    ?TECHNIQUE:MRI KNEE LEFT WO IV CONTRAST-- Multiplanar, multisequence MR imaging of the knee was performed.  ? ?FINDINGS:  ? ?INTERNAL STRUCTURES:  ?- Medial meniscus: Complex chronic appearing tear of the body and posterior horn of the medial meniscus.  ?- Lateral meniscus: Intact.  ?- Anterior cruciate ligament:  Intact.  ?- Posterior cruciate ligament: Intact.  ?- Medial collateral ligament:  Intact.  ?- Lateral collateral ligament:  Intact.  ? ?BONES/JOINTS/CARTILAGE/EXTENSOR MECHANISM:  ?- No acute fractures.  ?- Arthrosis with severe medial compartment predominance.  ?- Minimal knee joint effusion.  ?- Extensor mechanism is intact.  ? ?SOFT TISSUES:  ?- Intact.  ? ? ?IMPRESSION:  ?1.  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  ?2.  Arthrosis with severe medial compartment predominance.  ? ?Electronically Signed by: Mariea Clonts on 06/18/2021 5:16 PM ? ?Assessment and Plan:  ? ?  ICD-10-CM   ?1. Primary osteoarthritis of left knee  M17.12   ?  ?2. Other fatigue  R53.83 Vitamin B12  ?  ? ?Ongoing left-sided knee pain, severe medial compartmental osteoarthritis with exacerbation. ? ?Most recent left-sided knee corticosteroid injection done on 03/04/2022, less than 6 weeks ago, provided some relief for maximum of 3 weeks, then she had a return of symptoms.  Right now she is got some very significant knee pain, she is ambulating with a limp. ? ?Failure of all manner of conservative care including oral NSAIDs, topical NSAIDs multiple intra-articular steroid injections, extensive rehab, and she continues to do poorly. ? ?Recommend viscosupplementation injection with a clinical indication of medial compartmental osteoarthritis prior to consideration of operative intervention. ? ?Clinically, this makes very good sense  prior to contemplation of arthroplasty. ? ?Orders placed today for conditions managed today: ?Orders Placed This Encounter  ?Procedures  ? Vitamin B12  ? ? ?Follow-up if needed: No follow-ups on file. ? ?Dragon Medical One speech-to-text software was used for transcription in this dictation.  Possible transcriptional errors can occur using Editor, commissioning.  ? ?Signed, ? ?Cian Costanzo T. Kennedy Brines, MD ? ? ?Outpatient Encounter Medications as of 04/17/2022  ?Medication Sig  ? b complex vitamins capsule Take 1 capsule by mouth daily.  ? Calcium Carbonate (CALCIUM 600 PO) Take 600 mg by mouth daily.  ? losartan-hydrochlorothiazide (HYZAAR) 50-12.5 MG tablet Take 1 tablet by mouth once daily  ? Misc Natural Products (JOINT HEALTH PO) Take 2 tablets by mouth daily.  ? Omega 3 1000 MG CAPS Take by mouth daily.  ? pravastatin (PRAVACHOL) 10 MG tablet Take 1 tablet by mouth once daily  ? TURMERIC PO Take 1,000 mg by  mouth daily.  ? vitamin C (ASCORBIC ACID) 500 MG tablet Take 500 mg by mouth daily.  ? VITAMIN D PO Take by mouth.  ? ?No facility-administered encounter medications on file as of 04/17/2022.  ?  ?

## 2022-04-18 LAB — VITAMIN B12: Vitamin B-12: 1146 pg/mL — ABNORMAL HIGH (ref 211–911)

## 2022-05-01 NOTE — Progress Notes (Signed)
Thank-you so much for all of your help coordinating this sort of thing.

## 2022-05-02 NOTE — Progress Notes (Signed)
The direct fax to our back office is [phone] Attn: Hedy Camara  Thank-you!

## 2022-05-02 NOTE — Progress Notes (Signed)
Thank-you so much.  We are doing that right now.

## 2022-05-20 ENCOUNTER — Encounter: Payer: Self-pay | Admitting: Family Medicine

## 2022-05-27 NOTE — Telephone Encounter (Signed)
Review physical paperwork again and attempt to run Durolane.

## 2022-06-03 ENCOUNTER — Other Ambulatory Visit: Payer: Self-pay | Admitting: Family Medicine

## 2022-06-03 MED ORDER — LOSARTAN POTASSIUM-HCTZ 50-12.5 MG PO TABS: 1.0000 | ORAL_TABLET | Freq: Every day | ORAL | 0 refills | Status: DC

## 2022-06-03 MED ORDER — PRAVASTATIN SODIUM 10 MG PO TABS: 10.0000 mg | ORAL_TABLET | Freq: Every day | ORAL | 0 refills | Status: DC

## 2022-06-12 ENCOUNTER — Telehealth: Payer: Self-pay | Admitting: Family Medicine

## 2022-06-12 DIAGNOSIS — E782 Mixed hyperlipidemia: Secondary | ICD-10-CM

## 2022-06-12 NOTE — Telephone Encounter (Signed)
-----   Message from Ellamae Sia sent at 06/03/2022  3:07 PM EDT ----- Regarding: Lab orders for Tuesday, 7.28.23 Patient is scheduled for CPX labs, please order future labs, Thanks , Karna Christmas

## 2022-06-18 ENCOUNTER — Other Ambulatory Visit (INDEPENDENT_AMBULATORY_CARE_PROVIDER_SITE_OTHER): Payer: Commercial Managed Care - HMO

## 2022-06-18 DIAGNOSIS — E782 Mixed hyperlipidemia: Secondary | ICD-10-CM

## 2022-06-18 LAB — COMPREHENSIVE METABOLIC PANEL
ALT: 20 U/L (ref 0–35)
AST: 20 U/L (ref 0–37)
Albumin: 4.7 g/dL (ref 3.5–5.2)
Alkaline Phosphatase: 52 U/L (ref 39–117)
BUN: 20 mg/dL (ref 6–23)
CO2: 29 mEq/L (ref 19–32)
Calcium: 9.8 mg/dL (ref 8.4–10.5)
Chloride: 102 mEq/L (ref 96–112)
Creatinine, Ser: 0.78 mg/dL (ref 0.40–1.20)
GFR: 82.66 mL/min (ref >60.00)
Glucose, Bld: 104 mg/dL — ABNORMAL HIGH (ref 70–99)
Potassium: 3.8 mEq/L (ref 3.5–5.1)
Sodium: 141 mEq/L (ref 135–145)
Total Bilirubin: 0.7 mg/dL (ref 0.2–1.2)
Total Protein: 7.4 g/dL (ref 6.0–8.3)

## 2022-06-18 LAB — LIPID PANEL
Cholesterol: 245 mg/dL — ABNORMAL HIGH (ref 0–200)
HDL: 51.6 mg/dL (ref >39.00)
NonHDL: 193.5
Total CHOL/HDL Ratio: 5
Triglycerides: 267 mg/dL — ABNORMAL HIGH (ref 0.0–149.0)
VLDL: 53.4 mg/dL — ABNORMAL HIGH (ref 0.0–40.0)

## 2022-06-18 LAB — LDL CHOLESTEROL, DIRECT: Direct LDL: 146 mg/dL

## 2022-06-19 NOTE — Progress Notes (Signed)
No critical labs need to be addressed urgently. We will discuss labs in detail at upcoming office visit.   

## 2022-06-25 ENCOUNTER — Ambulatory Visit (INDEPENDENT_AMBULATORY_CARE_PROVIDER_SITE_OTHER): Payer: Commercial Managed Care - HMO | Admitting: Family Medicine

## 2022-06-25 ENCOUNTER — Encounter: Payer: Self-pay | Admitting: Family Medicine

## 2022-06-25 VITALS — BP 114/76 | HR 76 | Temp 98.2°F | Ht 62.25 in | Wt 132.4 lb

## 2022-06-25 DIAGNOSIS — Z0000 Encounter for general adult medical examination without abnormal findings: Secondary | ICD-10-CM

## 2022-06-25 DIAGNOSIS — Z1211 Encounter for screening for malignant neoplasm of colon: Secondary | ICD-10-CM | POA: Diagnosis not present

## 2022-06-25 DIAGNOSIS — I10 Essential (primary) hypertension: Secondary | ICD-10-CM

## 2022-06-25 DIAGNOSIS — E782 Mixed hyperlipidemia: Secondary | ICD-10-CM

## 2022-06-25 NOTE — Assessment & Plan Note (Signed)
Stable, chronic.  Continue current medication.   Elevated slightly in the last year given the dietary changes.  She will get back on track increase exercise to help with energy.  Continue pravastatin 10 mg p.o. daily.  Consider rechecking in 3 to 6 months.  If not at goal LDL < 100 consider increasing pravastatin dose.

## 2022-06-25 NOTE — Progress Notes (Signed)
Patient ID: [name], female    DOB: Feb 13, 1961, 61 y.o.   MRN: [id number]  This visit was conducted in person.  BP 114/76   Pulse 76   Temp 98.2 F (36.8 C) (Oral)   Ht 5' 2.25" (1.581 m)   Wt 132 lb 6 oz (60 kg)   LMP 11/02/2011 (Exact Date)   SpO2 98%   BMI 24.02 kg/m    CC:  Chief Complaint  Patient presents with   Annual Exam    Subjective:   HPI: [name] is a 61 y.o. female presenting on 06/25/2022 for Annual Exam  Hypertension:  Good control on losartan hydrochlorothiazide 50/12.5 mg daily. BP Readings from Last 3 Encounters:  06/25/22 114/76  04/17/22 104/70  03/04/22 130/90  Using medication without problems or lightheadedness: none Chest pain with exertion:none Edema:none Short of breath:none Average home BPs: Other issues:  Elevated Cholesterol: Chronic, tolerable control on pravastatin 10 mg daily, but worse in last year given more choclate Lab Results  Component Value Date   CHOL 245 (H) 06/18/2022   HDL 51.60 06/18/2022   LDLCALC 122 (H) 06/01/2020   LDLDIRECT 146.0 06/18/2022   TRIG 267.0 (H) 06/18/2022   CHOLHDL 5 06/18/2022   The 10-year ASCVD risk score (Arnett DK, et al., 2019) is: 4.3%   Values used to calculate the score:     Age: 25 years     Sex: Female     Is Non-Hispanic African American: No     Diabetic: No     Tobacco smoker: No     Systolic Blood Pressure: 160 mmHg     Is BP treated: Yes     HDL Cholesterol: 51.6 mg/dL     Total Cholesterol: 245 mg/dL  Using medications without problems: none Muscle aches:  none Diet compliance given right knee pain: eating green, healthy Exercise: limited  Other complaints:       Relevant past medical, surgical, family and social history reviewed and updated as indicated. Interim medical history since our last visit reviewed. Allergies and medications reviewed and updated. Outpatient Medications Prior to Visit  Medication Sig Dispense Refill   b complex vitamins  capsule Take 1 capsule by mouth daily.     Calcium Carbonate (CALCIUM 600 PO) Take 600 mg by mouth daily.     losartan-hydrochlorothiazide (HYZAAR) 50-12.5 MG tablet Take 1 tablet by mouth daily. 90 tablet 0   Misc Natural Products (JOINT HEALTH PO) Take 2 tablets by mouth daily.     Omega 3 1000 MG CAPS Take by mouth daily.     pravastatin (PRAVACHOL) 10 MG tablet Take 1 tablet (10 mg total) by mouth daily. 90 tablet 0   TURMERIC PO Take 1,000 mg by mouth daily.     vitamin C (ASCORBIC ACID) 500 MG tablet Take 500 mg by mouth daily.     VITAMIN D PO Take by mouth.     No facility-administered medications prior to visit.     Per HPI unless specifically indicated in ROS section below Review of Systems  Constitutional:  Negative for fatigue and fever.  HENT:  Negative for congestion.   Eyes:  Negative for pain.  Respiratory:  Negative for cough and shortness of breath.   Cardiovascular:  Negative for chest pain, palpitations and leg swelling.  Gastrointestinal:  Negative for abdominal pain.  Genitourinary:  Negative for dysuria and vaginal bleeding.  Musculoskeletal:  Negative for back pain.  Neurological:  Negative for syncope, light-headedness and headaches.  Psychiatric/Behavioral:  Negative for dysphoric mood.    Objective:  BP 114/76   Pulse 76   Temp 98.2 F (36.8 C) (Oral)   Ht 5' 2.25" (1.581 m)   Wt 132 lb 6 oz (60 kg)   LMP 11/02/2011 (Exact Date)   SpO2 98%   BMI 24.02 kg/m   Wt Readings from Last 3 Encounters:  06/25/22 132 lb 6 oz (60 kg)  04/17/22 133 lb 9 oz (60.6 kg)  03/04/22 133 lb 1 oz (60.4 kg)      Physical Exam Constitutional:      General: She is not in acute distress.    Appearance: Normal appearance. She is well-developed. She is not ill-appearing or toxic-appearing.  HENT:     Head: Normocephalic.     Right Ear: Hearing, tympanic membrane, ear canal and external ear normal. Tympanic membrane is not erythematous, retracted or bulging.     Left  Ear: Hearing, tympanic membrane, ear canal and external ear normal. Tympanic membrane is not erythematous, retracted or bulging.     Nose: No mucosal edema or rhinorrhea.     Right Sinus: No maxillary sinus tenderness or frontal sinus tenderness.     Left Sinus: No maxillary sinus tenderness or frontal sinus tenderness.     Mouth/Throat:     Pharynx: Uvula midline.  Eyes:     General: Lids are normal. Lids are everted, no foreign bodies appreciated.     Conjunctiva/sclera: Conjunctivae normal.     Pupils: Pupils are equal, round, and reactive to light.  Neck:     Thyroid: No thyroid mass or thyromegaly.     Vascular: No carotid bruit.     Trachea: Trachea normal.  Cardiovascular:     Rate and Rhythm: Normal rate and regular rhythm.     Pulses: Normal pulses.     Heart sounds: Normal heart sounds, S1 normal and S2 normal. No murmur heard.    No friction rub. No gallop.  Pulmonary:     Effort: Pulmonary effort is normal. No tachypnea or respiratory distress.     Breath sounds: Normal breath sounds. No decreased breath sounds, wheezing, rhonchi or rales.  Abdominal:     General: Bowel sounds are normal.     Palpations: Abdomen is soft.     Tenderness: There is no abdominal tenderness.  Musculoskeletal:     Cervical back: Normal range of motion and neck supple.  Skin:    General: Skin is warm and dry.     Findings: No rash.  Neurological:     Mental Status: She is alert.  Psychiatric:        Mood and Affect: Mood is not anxious or depressed.        Speech: Speech normal.        Behavior: Behavior normal. Behavior is cooperative.        Thought Content: Thought content normal.        Judgment: Judgment normal.       Results for orders placed or performed in visit on 06/18/22  Comprehensive metabolic panel  Result Value Ref Range   Sodium 141 135 - 145 mEq/L   Potassium 3.8 3.5 - 5.1 mEq/L   Chloride 102 96 - 112 mEq/L   CO2 29 19 - 32 mEq/L   Glucose, Bld 104 (H) 70 - 99  mg/dL   BUN 20 6 - 23 mg/dL   Creatinine, Ser 0.78 0.40 - 1.20 mg/dL   Total Bilirubin 0.7 0.2 - 1.2  mg/dL   Alkaline Phosphatase 52 39 - 117 U/L   AST 20 0 - 37 U/L   ALT 20 0 - 35 U/L   Total Protein 7.4 6.0 - 8.3 g/dL   Albumin 4.7 3.5 - 5.2 g/dL   GFR 82.66 >60.00 mL/min   Calcium 9.8 8.4 - 10.5 mg/dL  Lipid panel  Result Value Ref Range   Cholesterol 245 (H) 0 - 200 mg/dL   Triglycerides 267.0 (H) 0.0 - 149.0 mg/dL   HDL 51.60 >39.00 mg/dL   VLDL 53.4 (H) 0.0 - 40.0 mg/dL   Total CHOL/HDL Ratio 5    NonHDL 193.50   LDL cholesterol, direct  Result Value Ref Range   Direct LDL 146.0 mg/dL     COVID 19 screen:  No recent travel or known exposure to COVID19 The patient denies respiratory symptoms of COVID 19 at this time. The importance of social distancing was discussed today.   Assessment and Plan The patient's preventative maintenance and recommended screening tests for an annual wellness exam were reviewed in full today. Brought up to date unless services declined.  Counselled on the importance of diet, exercise, and its role in overall health and mortality. The patient's FH and SH was reviewed, including their home life, tobacco status, and drug and alcohol status.   Vaccines:  uptodate with TD, COVID19 x 3, given shingrix. Colon: 2003 Dr. Olevia Perches, nml, recommend repeat in 10 years.. we will do IFOB yearly... neg 06/2020, 07/2021....  would like to continue IFOB Mammo:05/2021 Pap/DVE:  TAH for noncancer 2012 per GYN, followed by GYN. Refused HIV testing.  Plan DEXA age 61.  Problem List Items Addressed This Visit     HTN (hypertension)    Stable, chronic.  Continue current medication.   Losartan hydrochlorothiazide 50/12.5 mg p.o. daily.      Hyperlipidemia    Stable, chronic.  Continue current medication.   Elevated slightly in the last year given the dietary changes.  She will get back on track increase exercise to help with energy.  Continue pravastatin 10  mg p.o. daily.  Consider rechecking in 3 to 6 months.  If not at goal LDL < 100 consider increasing pravastatin dose.       Other Visit Diagnoses     Routine general medical examination at a health care facility    -  Primary   Colon cancer screening       Relevant Orders   Fecal occult blood, imunochemical        Eliezer Lofts, MD

## 2022-06-25 NOTE — Assessment & Plan Note (Signed)
Stable, chronic.  Continue current medication.   Losartan hydrochlorothiazide 50/12.5 mg p.o. daily.

## 2022-06-25 NOTE — Patient Instructions (Addendum)
Work on low cholesterol diet.  Could return in 3-6 months for repeat cholesterol check if you would like.  Work on low impact exercise.  Pick up stool test in the lab on way out.

## 2022-09-03 ENCOUNTER — Telehealth: Payer: Self-pay | Admitting: Family Medicine

## 2022-09-03 ENCOUNTER — Other Ambulatory Visit: Payer: Self-pay | Admitting: Family Medicine

## 2022-09-03 NOTE — Telephone Encounter (Signed)
Caller Name: ta fair Call back phone #: [phone]  MEDICATION(S):  pravastatin (PRAVACHOL) 10 MG tablet losartan-hydrochlorothiazide (HYZAAR) 50-12.5 MG tablet  Days of Med Remaining: 5  Has the patient contacted their pharmacy (YES/NO)? NO What did pharmacy advise?   Preferred Pharmacy:  [facility] , [address]   ~~~Please advise patient/caregiver to allow 2-3 business days to process RX refills.

## 2022-09-03 NOTE — Telephone Encounter (Signed)
Refills sent as requested

## 2022-12-23 ENCOUNTER — Other Ambulatory Visit: Payer: Self-pay | Admitting: Family Medicine

## 2023-02-10 IMAGING — MG MM DIGITAL SCREENING BILAT W/ TOMO AND CAD
8 series · 9 of 24 positions shown · non-contrast
Comparison: Previous exam(s).

CLINICAL DATA: Screening.

EXAM:
DIGITAL SCREENING BILATERAL MAMMOGRAM WITH TOMOSYNTHESIS AND CAD
TECHNIQUE: Bilateral screening digital craniocaudal and mediolateral oblique
mammograms were obtained. Bilateral screening digital breast
tomosynthesis was performed. The images were evaluated with
computer-aided detection.

[L MLO synth-2D]
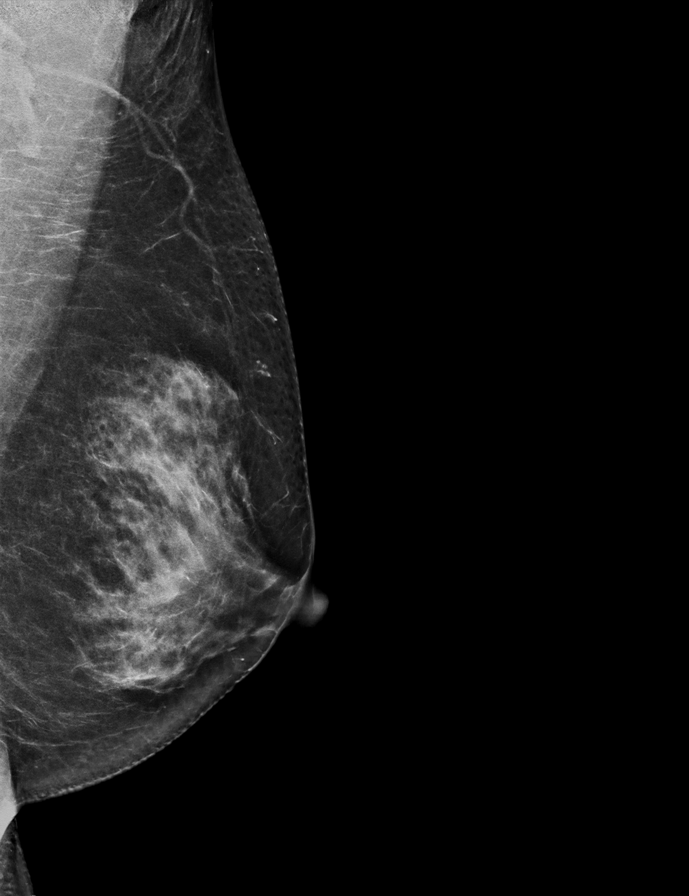

[L CC synth-2D]
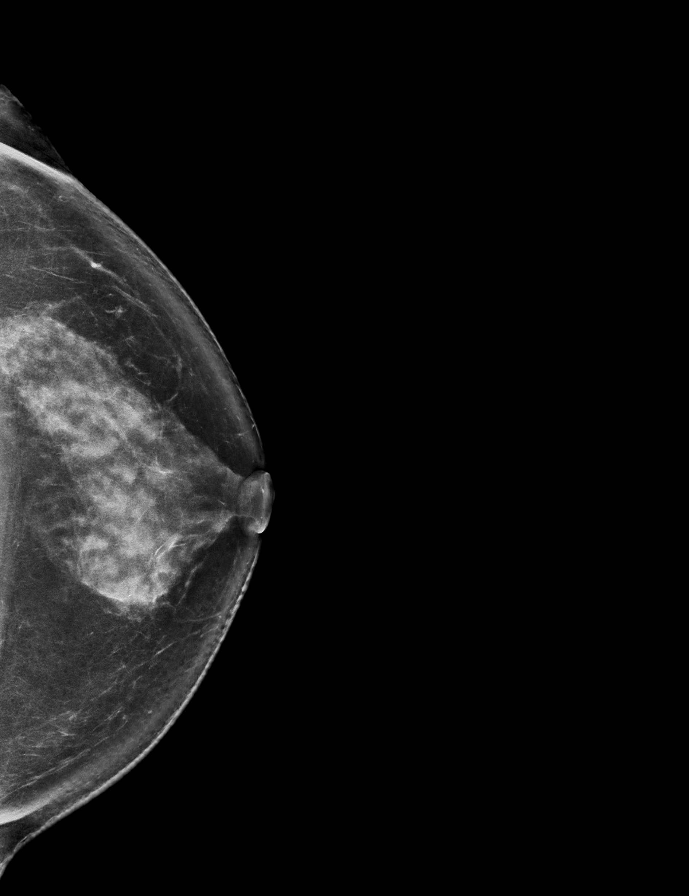

[R CC synth-2D]
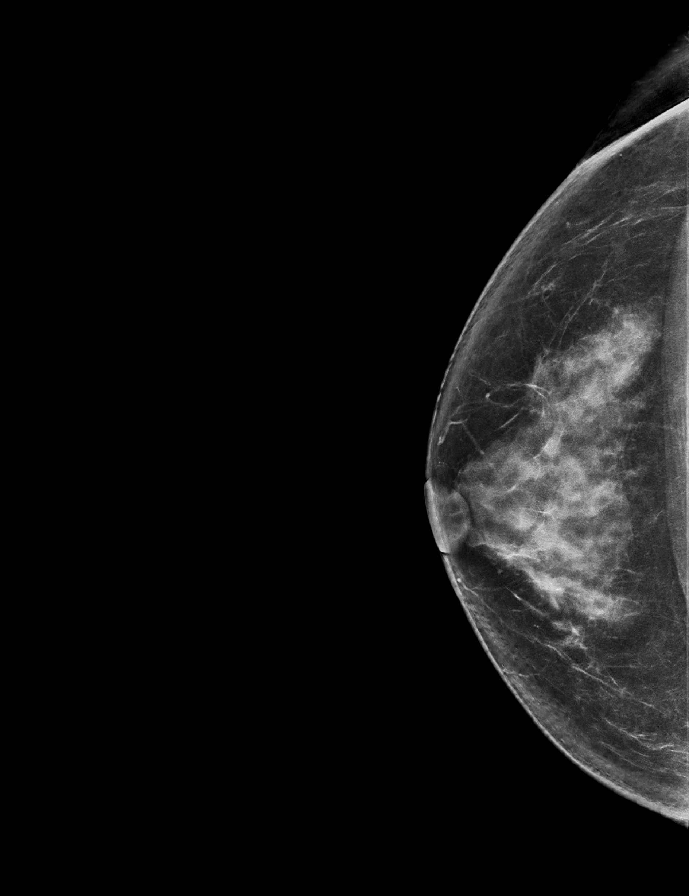

[R MLO synth-2D]
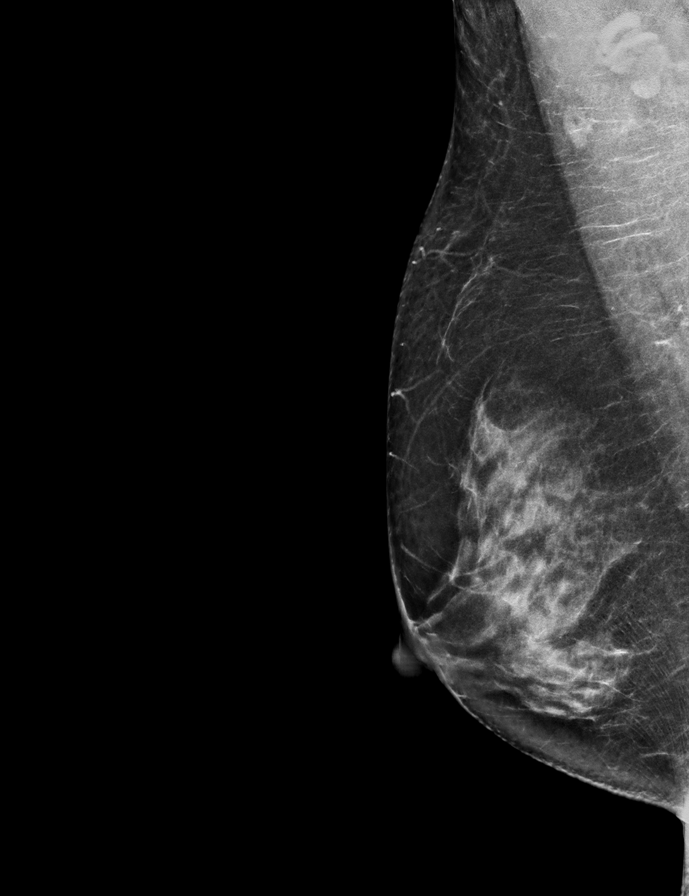

[R MLO tomo · 2 of 71 frames shown]
[frame 23/71]
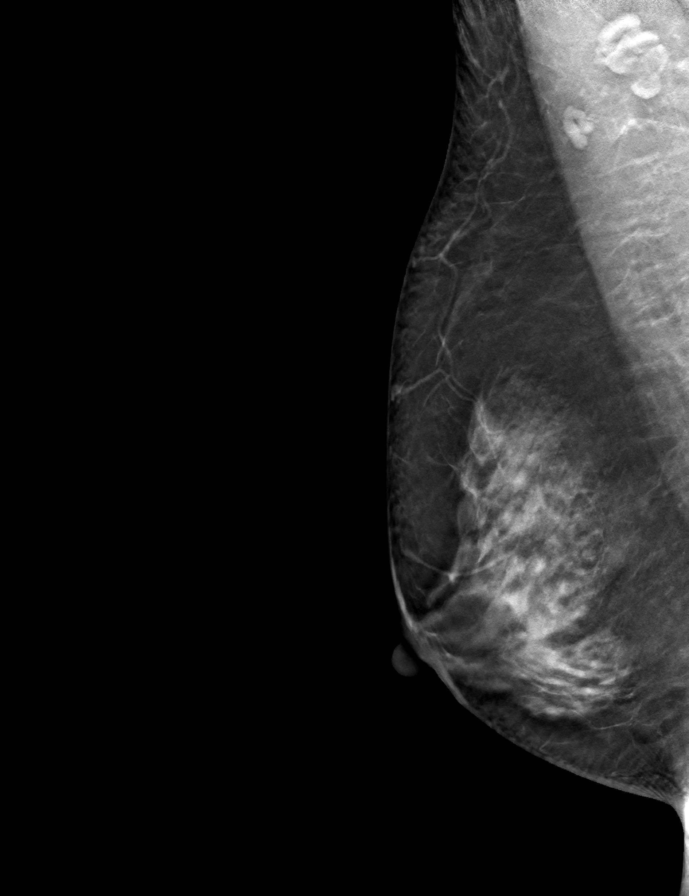
[frame 36/71]
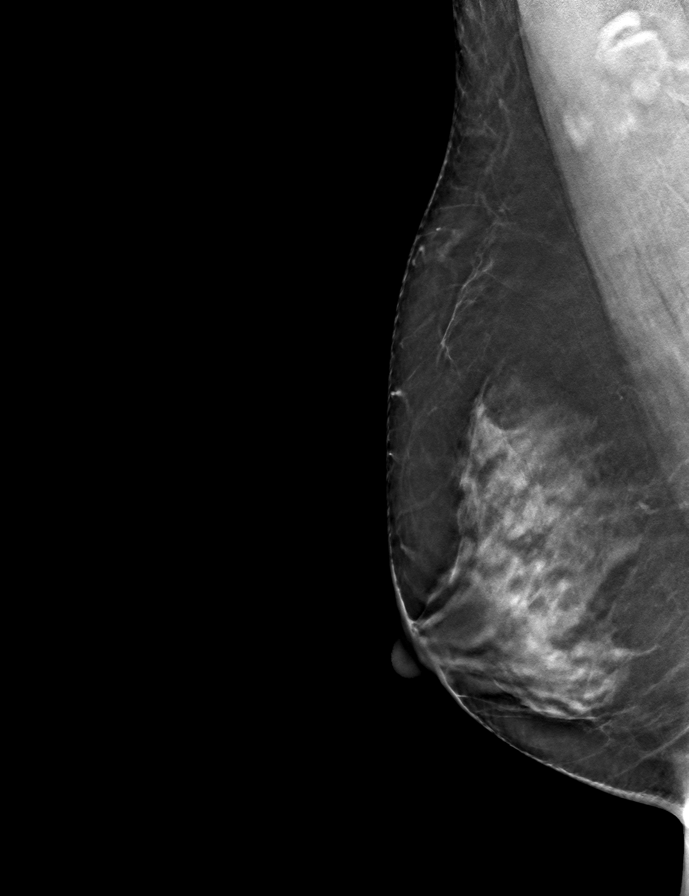

[L CC tomo · tomo slice 41/80.0]
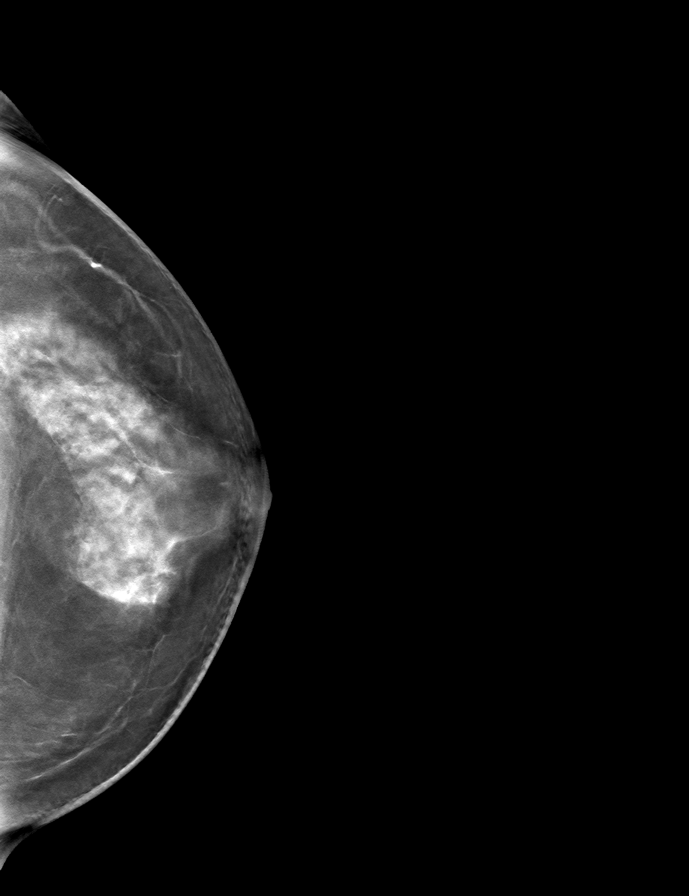

[L MLO tomo · tomo slice 35/69.0]
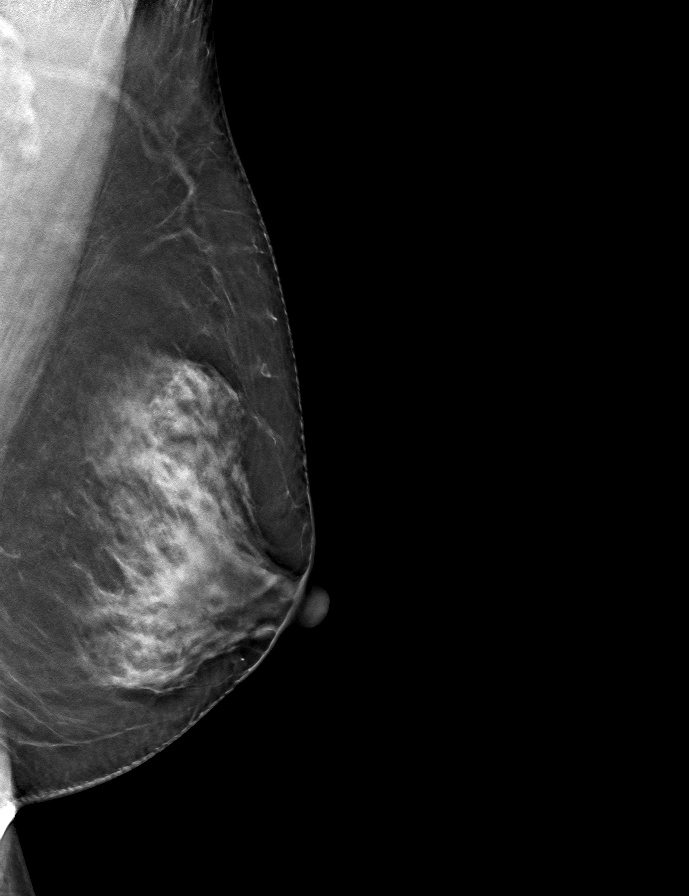

[R CC tomo · tomo slice 39/78.0]
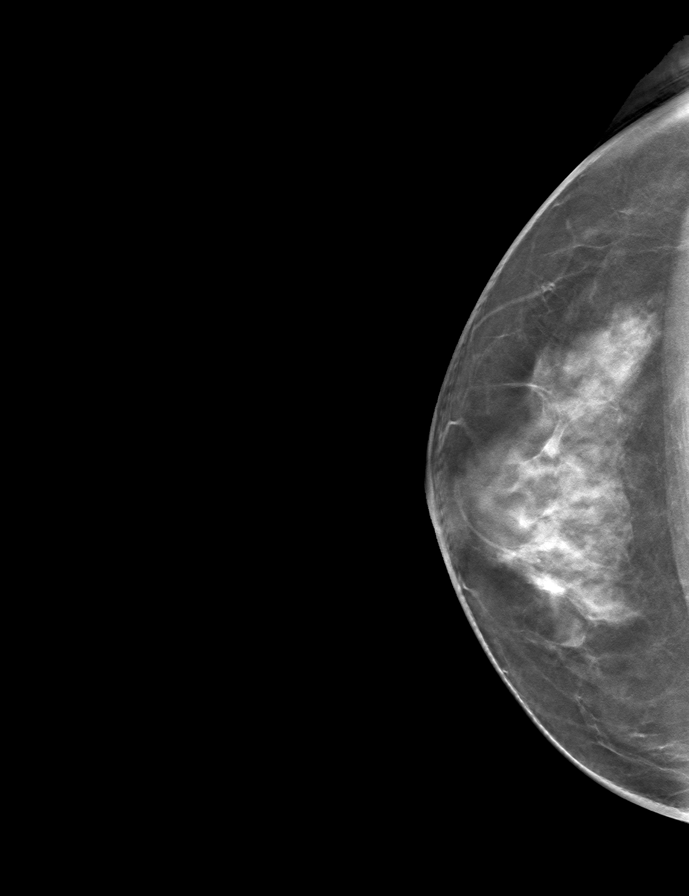

[9 of 24 positions shown; findings below may reference images not displayed]

ACR Breast Density Category c: The breast tissue is heterogeneously
dense, which may obscure small masses.
FINDINGS: There are no findings suspicious for malignancy.
IMPRESSION: No mammographic evidence of malignancy. A result letter of this
screening mammogram will be mailed directly to the patient.

RECOMMENDATION:
Screening mammogram in one year. (Code:Q3-W-BC3)

BI-RADS CATEGORY  1: Negative.

## 2023-12-31 ENCOUNTER — Encounter (HOSPITAL_COMMUNITY): Payer: Self-pay

## 2023-12-31 ENCOUNTER — Emergency Department (HOSPITAL_COMMUNITY)
Admission: EM | Admit: 2023-12-31 | Discharge: 2023-12-31 | Disposition: A | Discharge status: Home / Self Care | Payer: Self-pay | Attending: Emergency Medicine | Admitting: Emergency Medicine

## 2023-12-31 DIAGNOSIS — M546 Pain in thoracic spine: Secondary | ICD-10-CM | POA: Diagnosis present

## 2023-12-31 DIAGNOSIS — Y92410 Unspecified street and highway as the place of occurrence of the external cause: Secondary | ICD-10-CM | POA: Diagnosis not present

## 2023-12-31 DIAGNOSIS — R519 Headache, unspecified: Secondary | ICD-10-CM | POA: Insufficient documentation

## 2023-12-31 MED ORDER — ACETAMINOPHEN 500 MG PO TABS
1000.0000 mg | ORAL_TABLET | Freq: Once | ORAL | Status: AC
  Administered 2023-12-31: 1000 mg via ORAL

## 2023-12-31 MED ORDER — OXYCODONE HCL 5 MG PO TABS
5.0000 mg | ORAL_TABLET | Freq: Once | ORAL | Status: AC
  Administered 2023-12-31: 5 mg via ORAL
  Filled 2023-12-31: qty 1

## 2023-12-31 NOTE — Discharge Instructions (Signed)
You will hurt worse tomorrow.  That is normal.  Please follow-up with your family doctor in the office.  Please return for difficulty breathing confusion vomiting sudden worsening headache. Take 4 over the counter ibuprofen tablets 3 times a day or 2 over-the-counter naproxen tablets twice a day for pain. Also take tylenol 1000mg (2 extra strength) four times a day.
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
## 2024-01-05 ENCOUNTER — Telehealth: Payer: Self-pay

## 2024-01-05 NOTE — Transitions of Care (Post Inpatient/ED Visit) (Signed)
Unable to reach patient by phone and left v/m requesting call back at [phone].        01/05/2024  Name: [name] MRN: [id number] DOB: [birth year]  Today's TOC FU Call Status: Today's TOC FU Call Status:: Unsuccessful Call (1st Attempt) Unsuccessful Call (1st Attempt) Date: 01/05/24  Attempted to reach the patient regarding the most recent Inpatient/ED visit.  Follow Up Plan: Additional outreach attempts will be made to reach the patient to complete the Transitions of Care (Post Inpatient/ED visit) call.   Signature  Lewanda Rife, LPN

## 2024-01-08 NOTE — Transitions of Care (Post Inpatient/ED Visit) (Signed)
 Per chart review tab pt was seen by [facility] PCP Dr Slater Sole at [facility] 01/08/24 for ED FU. Pt has not seen Greig Ring MD since 06/25/2022. Sending note to Dr Greig Ring.,        01/08/2024  Name: [name] MRN: [id number] DOB: 11-14-1961  Today's TOC FU Call Status: Today's TOC FU Call Status:: Unsuccessful Call (1st Attempt) Unsuccessful Call (1st Attempt) Date: 01/05/24  Attempted to reach the patient regarding the most recent Inpatient/ED visit.  Follow Up Plan: No further outreach attempts will be made at this time. We have been unable to contact the patient.  Signature Laray Arenas, LPN

## 2024-01-09 NOTE — Telephone Encounter (Signed)
 Noted.
